# Patient Record
Sex: Female | Born: 1970 | Race: Black or African American | Hispanic: No | Marital: Single | Smoking: Former smoker
Health system: Southern US, Community
[De-identification: ages and names within clinical notes are randomized; demographics above are authoritative.]

## PROBLEM LIST (undated history)

## (undated) DIAGNOSIS — D649 Anemia, unspecified: Secondary | ICD-10-CM

## (undated) DIAGNOSIS — M1990 Unspecified osteoarthritis, unspecified site: Secondary | ICD-10-CM

## (undated) DIAGNOSIS — F329 Major depressive disorder, single episode, unspecified: Secondary | ICD-10-CM

## (undated) DIAGNOSIS — M797 Fibromyalgia: Secondary | ICD-10-CM

## (undated) DIAGNOSIS — F32A Depression, unspecified: Secondary | ICD-10-CM

## (undated) DIAGNOSIS — M1611 Unilateral primary osteoarthritis, right hip: Secondary | ICD-10-CM

## (undated) DIAGNOSIS — I10 Essential (primary) hypertension: Secondary | ICD-10-CM

## (undated) HISTORY — DX: Anemia, unspecified: D64.9

## (undated) HISTORY — DX: Unilateral primary osteoarthritis, right hip: M16.11

## (undated) HISTORY — PX: OTHER SURGICAL HISTORY: SHX169

---

## 1998-05-17 ENCOUNTER — Encounter: Admission: RE | Admit: 1998-05-17 | Discharge: 1998-05-17 | Payer: Self-pay | Admitting: Family Medicine

## 1998-05-17 ENCOUNTER — Emergency Department (HOSPITAL_COMMUNITY): Admission: EM | Admit: 1998-05-17 | Discharge: 1998-05-17 | Payer: Self-pay | Admitting: Emergency Medicine

## 1998-07-02 ENCOUNTER — Emergency Department (HOSPITAL_COMMUNITY): Admission: EM | Admit: 1998-07-02 | Discharge: 1998-07-02 | Payer: Self-pay | Admitting: Emergency Medicine

## 1998-07-04 ENCOUNTER — Encounter: Admission: RE | Admit: 1998-07-04 | Discharge: 1998-07-04 | Payer: Self-pay | Admitting: Family Medicine

## 1998-07-06 ENCOUNTER — Ambulatory Visit (HOSPITAL_COMMUNITY): Admission: RE | Admit: 1998-07-06 | Discharge: 1998-07-06 | Payer: Self-pay

## 1998-07-09 ENCOUNTER — Encounter: Admission: RE | Admit: 1998-07-09 | Discharge: 1998-10-07 | Payer: Self-pay

## 1998-07-24 ENCOUNTER — Encounter: Admission: RE | Admit: 1998-07-24 | Discharge: 1998-07-24 | Payer: Self-pay | Admitting: Family Medicine

## 1998-08-21 ENCOUNTER — Encounter: Admission: RE | Admit: 1998-08-21 | Discharge: 1998-08-21 | Payer: Self-pay | Admitting: Family Medicine

## 1998-08-23 ENCOUNTER — Encounter: Admission: RE | Admit: 1998-08-23 | Discharge: 1998-08-23 | Payer: Self-pay | Admitting: Family Medicine

## 1998-08-28 ENCOUNTER — Encounter: Admission: RE | Admit: 1998-08-28 | Discharge: 1998-08-28 | Payer: Self-pay | Admitting: Family Medicine

## 1999-01-01 ENCOUNTER — Encounter: Admission: RE | Admit: 1999-01-01 | Discharge: 1999-01-01 | Payer: Self-pay | Admitting: Family Medicine

## 1999-05-13 ENCOUNTER — Encounter: Admission: RE | Admit: 1999-05-13 | Discharge: 1999-05-13 | Payer: Self-pay | Admitting: Sports Medicine

## 1999-09-19 ENCOUNTER — Encounter: Admission: RE | Admit: 1999-09-19 | Discharge: 1999-09-19 | Payer: Self-pay | Admitting: Family Medicine

## 1999-09-30 ENCOUNTER — Encounter: Admission: RE | Admit: 1999-09-30 | Discharge: 1999-09-30 | Payer: Self-pay | Admitting: Family Medicine

## 1999-10-09 ENCOUNTER — Encounter: Payer: Self-pay | Admitting: Emergency Medicine

## 1999-10-09 ENCOUNTER — Emergency Department (HOSPITAL_COMMUNITY): Admission: EM | Admit: 1999-10-09 | Discharge: 1999-10-09 | Payer: Self-pay | Admitting: Emergency Medicine

## 1999-10-28 ENCOUNTER — Encounter: Admission: RE | Admit: 1999-10-28 | Discharge: 1999-10-28 | Payer: Self-pay | Admitting: Sports Medicine

## 1999-11-14 ENCOUNTER — Encounter: Admission: RE | Admit: 1999-11-14 | Discharge: 1999-11-14 | Payer: Self-pay | Admitting: Family Medicine

## 1999-12-08 ENCOUNTER — Encounter: Admission: RE | Admit: 1999-12-08 | Discharge: 1999-12-08 | Payer: Self-pay | Admitting: Family Medicine

## 1999-12-15 ENCOUNTER — Encounter: Admission: RE | Admit: 1999-12-15 | Discharge: 1999-12-15 | Payer: Self-pay | Admitting: Family Medicine

## 1999-12-29 ENCOUNTER — Encounter: Admission: RE | Admit: 1999-12-29 | Discharge: 1999-12-29 | Payer: Self-pay | Admitting: Family Medicine

## 2000-01-01 ENCOUNTER — Encounter: Admission: RE | Admit: 2000-01-01 | Discharge: 2000-01-01 | Payer: Self-pay | Admitting: Sports Medicine

## 2000-01-01 ENCOUNTER — Encounter: Payer: Self-pay | Admitting: Sports Medicine

## 2000-03-23 ENCOUNTER — Emergency Department (HOSPITAL_COMMUNITY): Admission: EM | Admit: 2000-03-23 | Discharge: 2000-03-23 | Payer: Self-pay | Admitting: Emergency Medicine

## 2000-03-23 ENCOUNTER — Encounter: Payer: Self-pay | Admitting: Emergency Medicine

## 2000-03-26 ENCOUNTER — Encounter: Admission: RE | Admit: 2000-03-26 | Discharge: 2000-03-26 | Payer: Self-pay | Admitting: Family Medicine

## 2000-05-11 ENCOUNTER — Encounter: Admission: RE | Admit: 2000-05-11 | Discharge: 2000-06-16 | Payer: Self-pay | Admitting: *Deleted

## 2000-11-22 ENCOUNTER — Encounter: Admission: RE | Admit: 2000-11-22 | Discharge: 2000-11-22 | Payer: Self-pay | Admitting: Family Medicine

## 2000-12-13 ENCOUNTER — Encounter: Admission: RE | Admit: 2000-12-13 | Discharge: 2000-12-13 | Payer: Self-pay | Admitting: Family Medicine

## 2001-03-15 ENCOUNTER — Encounter: Admission: RE | Admit: 2001-03-15 | Discharge: 2001-03-15 | Payer: Self-pay | Admitting: Sports Medicine

## 2001-07-27 ENCOUNTER — Encounter: Admission: RE | Admit: 2001-07-27 | Discharge: 2001-07-27 | Payer: Self-pay | Admitting: Family Medicine

## 2001-12-22 ENCOUNTER — Inpatient Hospital Stay (HOSPITAL_COMMUNITY): Admission: AD | Admit: 2001-12-22 | Discharge: 2001-12-22 | Payer: Self-pay | Admitting: *Deleted

## 2002-09-19 ENCOUNTER — Encounter: Payer: Self-pay | Admitting: Emergency Medicine

## 2002-09-19 ENCOUNTER — Emergency Department (HOSPITAL_COMMUNITY): Admission: EM | Admit: 2002-09-19 | Discharge: 2002-09-19 | Payer: Self-pay | Admitting: Emergency Medicine

## 2002-12-18 ENCOUNTER — Encounter (INDEPENDENT_AMBULATORY_CARE_PROVIDER_SITE_OTHER): Payer: Self-pay | Admitting: *Deleted

## 2002-12-18 ENCOUNTER — Encounter: Admission: RE | Admit: 2002-12-18 | Discharge: 2002-12-18 | Payer: Self-pay | Admitting: Family Medicine

## 2002-12-29 ENCOUNTER — Encounter: Admission: RE | Admit: 2002-12-29 | Discharge: 2002-12-29 | Payer: Self-pay | Admitting: Family Medicine

## 2003-01-05 ENCOUNTER — Encounter: Admission: RE | Admit: 2003-01-05 | Discharge: 2003-01-05 | Payer: Self-pay | Admitting: Family Medicine

## 2003-01-05 ENCOUNTER — Encounter: Payer: Self-pay | Admitting: *Deleted

## 2003-01-05 ENCOUNTER — Encounter: Admission: RE | Admit: 2003-01-05 | Discharge: 2003-01-05 | Payer: Self-pay | Admitting: *Deleted

## 2003-02-05 ENCOUNTER — Encounter: Admission: RE | Admit: 2003-02-05 | Discharge: 2003-02-05 | Payer: Self-pay | Admitting: Family Medicine

## 2003-02-08 ENCOUNTER — Encounter: Admission: RE | Admit: 2003-02-08 | Discharge: 2003-02-08 | Payer: Self-pay | Admitting: Family Medicine

## 2003-03-27 ENCOUNTER — Encounter: Admission: RE | Admit: 2003-03-27 | Discharge: 2003-03-27 | Payer: Self-pay | Admitting: Family Medicine

## 2003-04-09 ENCOUNTER — Encounter: Admission: RE | Admit: 2003-04-09 | Discharge: 2003-04-09 | Payer: Self-pay | Admitting: Family Medicine

## 2003-04-10 ENCOUNTER — Encounter: Admission: RE | Admit: 2003-04-10 | Discharge: 2003-04-10 | Payer: Self-pay | Admitting: Sports Medicine

## 2003-06-29 ENCOUNTER — Emergency Department (HOSPITAL_COMMUNITY): Admission: AD | Admit: 2003-06-29 | Discharge: 2003-06-29 | Payer: Self-pay

## 2003-06-29 ENCOUNTER — Emergency Department (HOSPITAL_COMMUNITY): Admission: EM | Admit: 2003-06-29 | Discharge: 2003-06-29 | Payer: Self-pay | Admitting: Emergency Medicine

## 2003-06-29 ENCOUNTER — Encounter: Payer: Self-pay | Admitting: Emergency Medicine

## 2004-07-16 ENCOUNTER — Encounter: Admission: RE | Admit: 2004-07-16 | Discharge: 2004-07-16 | Payer: Self-pay | Admitting: Family Medicine

## 2004-07-16 ENCOUNTER — Other Ambulatory Visit: Admission: RE | Admit: 2004-07-16 | Discharge: 2004-07-16 | Payer: Self-pay | Admitting: Family Medicine

## 2004-08-18 ENCOUNTER — Ambulatory Visit: Payer: Self-pay | Admitting: Family Medicine

## 2005-11-04 ENCOUNTER — Emergency Department (HOSPITAL_COMMUNITY): Admission: EM | Admit: 2005-11-04 | Discharge: 2005-11-04 | Payer: Self-pay | Admitting: Emergency Medicine

## 2006-11-11 ENCOUNTER — Emergency Department (HOSPITAL_COMMUNITY): Admission: EM | Admit: 2006-11-11 | Discharge: 2006-11-11 | Payer: Self-pay | Admitting: Family Medicine

## 2006-11-18 ENCOUNTER — Encounter (INDEPENDENT_AMBULATORY_CARE_PROVIDER_SITE_OTHER): Payer: Self-pay | Admitting: Family Medicine

## 2006-11-18 ENCOUNTER — Other Ambulatory Visit: Admission: RE | Admit: 2006-11-18 | Discharge: 2006-11-18 | Payer: Self-pay | Admitting: Family Medicine

## 2006-11-18 ENCOUNTER — Ambulatory Visit: Payer: Self-pay | Admitting: Family Medicine

## 2006-11-18 LAB — CONVERTED CEMR LAB
Chlamydia, DNA Probe: NEGATIVE
Cholesterol: 174 mg/dL (ref 0–200)
GC Probe Amp, Genital: NEGATIVE
HDL: 52 mg/dL (ref >39)
LDL Cholesterol: 108 mg/dL — ABNORMAL HIGH (ref 0–99)
Total CHOL/HDL Ratio: 3.3
Triglycerides: 68 mg/dL (ref <150)
VLDL: 14 mg/dL (ref 0–40)

## 2007-10-17 ENCOUNTER — Emergency Department (HOSPITAL_COMMUNITY): Admission: EM | Admit: 2007-10-17 | Discharge: 2007-10-17 | Payer: Self-pay | Admitting: Family Medicine

## 2011-08-24 LAB — POCT URINALYSIS DIP (DEVICE)
Glucose, UA: NEGATIVE
Ketones, ur: 80 — AB
Nitrite: NEGATIVE
Operator id: 235561
Protein, ur: 100 — AB
Specific Gravity, Urine: 1.015
Urobilinogen, UA: 1
pH: 6

## 2011-08-24 LAB — POCT PREGNANCY, URINE: Operator id: 235561

## 2011-08-24 LAB — INFLUENZA A AND B ANTIGEN (CONVERTED LAB)
Inflenza A Ag: NEGATIVE
Influenza B Ag: NEGATIVE

## 2011-11-24 ENCOUNTER — Emergency Department (HOSPITAL_COMMUNITY): Payer: Medicaid Other

## 2011-11-24 ENCOUNTER — Encounter: Payer: Self-pay | Admitting: Emergency Medicine

## 2011-11-24 ENCOUNTER — Emergency Department (HOSPITAL_COMMUNITY)
Admission: EM | Admit: 2011-11-24 | Discharge: 2011-11-25 | Disposition: A | Discharge status: Home / Self Care | Payer: Medicaid Other | Attending: Emergency Medicine | Admitting: Emergency Medicine

## 2011-11-24 DIAGNOSIS — M1610 Unilateral primary osteoarthritis, unspecified hip: Secondary | ICD-10-CM | POA: Insufficient documentation

## 2011-11-24 DIAGNOSIS — M169 Osteoarthritis of hip, unspecified: Secondary | ICD-10-CM | POA: Insufficient documentation

## 2011-11-24 DIAGNOSIS — M542 Cervicalgia: Secondary | ICD-10-CM | POA: Insufficient documentation

## 2011-11-24 DIAGNOSIS — M25559 Pain in unspecified hip: Secondary | ICD-10-CM | POA: Insufficient documentation

## 2011-11-24 MED ORDER — KETOROLAC TROMETHAMINE 60 MG/2ML IM SOLN
30.0000 mg | Freq: Once | INTRAMUSCULAR | Status: AC
  Administered 2011-11-25: 30 mg via INTRAMUSCULAR
  Filled 2011-11-24: qty 2

## 2011-11-24 NOTE — ED Provider Notes (Signed)
History     CSN: [id number]  Arrival date & time 11/24/11  2018   First MD Initiated Contact with Patient 11/24/11 2331      Chief Complaint  Patient presents with  . Leg Pain    (Consider location/radiation/quality/duration/timing/severity/associated sxs/prior treatment) HPI Comments: Patient with bilateral hip pain has been going on and consistent for 5-6 months.  She's been taking over-the-counter nonsteroidals without relief.  Has an appointment with her doctor next month.  She didn't call until last week.  She, states she's not resting because she can't get in a comfortable position.  Denies trauma denies previous history of hip pain.  Denies back pain.  Denies constipation, urinary tract symptoms, flank pain  Patient is a 41 y.o. female presenting with leg pain. The history is provided by the patient.  Leg Pain  The incident occurred more than 1 week ago. The incident occurred at home. There was no injury mechanism. The pain is present in the left hip and right hip. The pain is at a severity of 7/10. The pain is severe. The pain has been constant since onset. Pertinent negatives include no numbness. She has tried NSAIDs for the symptoms. The treatment provided no relief.    History reviewed. No pertinent past medical history.  History reviewed. No pertinent past surgical history.  No family history on file.  History  Substance Use Topics  . Smoking status: Never Smoker   . Smokeless tobacco: Not on file  . Alcohol Use: No    OB History    Grav Para Term Preterm Abortions TAB SAB Ect Mult Living                  Review of Systems  HENT: Positive for neck pain.   Eyes: Negative.   Respiratory: Negative for cough.   Gastrointestinal: Negative for nausea and vomiting.  Genitourinary: Negative for dysuria, frequency and flank pain.  Musculoskeletal: Positive for arthralgias. Negative for back pain and joint swelling.  Skin: Negative.   Neurological: Negative for  weakness and numbness.  Hematological: Negative.   Psychiatric/Behavioral: Negative.     Allergies  Codeine  Home Medications   Current Outpatient Rx  Name Route Sig Dispense Refill  . ALKA-SELTZER PLUS COLD PO Oral Take 1 tablet by mouth daily as needed. For congestion.     . IBUPROFEN 200 MG PO TABS Oral Take 400-600 mg by mouth every 6 (six) hours as needed. For pain.     Marland Kitchen NYQUIL PO Oral Take 1 capsule by mouth at bedtime as needed. For cold like symptoms.       BP 128/84  Pulse 77  Temp(Src) 98 F (36.7 C) (Oral)  Resp 19  SpO2 100%  LMP 11/24/2011  Physical Exam  Constitutional: She is oriented to person, place, and time. She appears well-developed and well-nourished.  HENT:  Head: Normocephalic.  Eyes: Pupils are equal, round, and reactive to light.  Neck: Normal range of motion.  Cardiovascular: Normal rate.   Musculoskeletal: Normal range of motion. She exhibits no edema and no tenderness.       No hip bursar pain no lumbar tenderness no SI joint pain   Neurological: She is alert and oriented to person, place, and time.  Skin: Skin is warm.  Psychiatric: She has a normal mood and affect.    ED Course  Procedures (including critical care time)  Labs Reviewed - No data to display Dg Pelvis 1-2 Views  11/25/2011  *RADIOLOGY REPORT*  Clinical Data: Bilateral hip pain for 3 months.  PELVIS - 1-2 VIEW  Comparison: None.  Findings: Hips are located.  There is mild joint space narrowing medially within the right hip joint. There is subchondral cystic change within the right femoral head.  No acute findings in the pelvis or sacrum.  IMPRESSION:  1.  No acute findings.  . 2.  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  Original Report Authenticated By: Genevive Bi, M.D.     1. Osteoarthritis of hip       MDM  Will x-ray, pelvis, to look at the hip joints and this is arthritis.  We'll provide the correct medication for patient and allow her to follow up with  her primary care physician as scheduled next month        Arman Filter, NP 11/25/11 0002  Arman Filter, NP 11/25/11 0234

## 2011-11-24 NOTE — ED Notes (Signed)
Patient with pain from ankle to hips bilaterally.  Patient states this has been going on for 4-5 months.  Patient is CAOx3, no loss of bowel or bladder habits.  Patient states she has been trying to get into her primary but does not have a appointment until next month and it has been bothering her more tonight and needs to be seen.

## 2011-11-24 NOTE — ED Notes (Signed)
PT. REPORTS BILATERAL LEG PAIN FOR 5 MONTHS , DENIES INJURY OR FALL , AMBULATORY.

## 2011-11-25 NOTE — ED Provider Notes (Signed)
Medical screening examination/treatment/procedure(s) were performed by non-physician practitioner and as supervising physician I was immediately available for consultation/collaboration.   Lyanne Co, MD 11/25/11 [phone]

## 2011-11-25 NOTE — ED Notes (Addendum)
Pt in stretcher when downtime charting started. See down time charting for info past 00:15. Note entered when charting came back up at [facility]

## 2012-01-04 ENCOUNTER — Telehealth (HOSPITAL_BASED_OUTPATIENT_CLINIC_OR_DEPARTMENT_OTHER): Payer: Self-pay | Admitting: *Deleted

## 2012-04-26 ENCOUNTER — Encounter (HOSPITAL_COMMUNITY): Payer: Self-pay | Admitting: *Deleted

## 2012-04-26 ENCOUNTER — Emergency Department (HOSPITAL_COMMUNITY)
Admission: EM | Admit: 2012-04-26 | Discharge: 2012-04-26 | Disposition: A | Discharge status: Home / Self Care | Payer: Self-pay | Attending: Emergency Medicine | Admitting: Emergency Medicine

## 2012-04-26 DIAGNOSIS — F3289 Other specified depressive episodes: Secondary | ICD-10-CM | POA: Insufficient documentation

## 2012-04-26 DIAGNOSIS — F329 Major depressive disorder, single episode, unspecified: Secondary | ICD-10-CM | POA: Insufficient documentation

## 2012-04-26 DIAGNOSIS — M791 Myalgia, unspecified site: Secondary | ICD-10-CM

## 2012-04-26 DIAGNOSIS — M79609 Pain in unspecified limb: Secondary | ICD-10-CM | POA: Insufficient documentation

## 2012-04-26 DIAGNOSIS — M1990 Unspecified osteoarthritis, unspecified site: Secondary | ICD-10-CM | POA: Insufficient documentation

## 2012-04-26 DIAGNOSIS — F17200 Nicotine dependence, unspecified, uncomplicated: Secondary | ICD-10-CM | POA: Insufficient documentation

## 2012-04-26 DIAGNOSIS — E876 Hypokalemia: Secondary | ICD-10-CM | POA: Insufficient documentation

## 2012-04-26 HISTORY — DX: Unspecified osteoarthritis, unspecified site: M19.90

## 2012-04-26 HISTORY — DX: Major depressive disorder, single episode, unspecified: F32.9

## 2012-04-26 HISTORY — DX: Depression, unspecified: F32.A

## 2012-04-26 LAB — COMPREHENSIVE METABOLIC PANEL
ALT: 13 U/L (ref 0–35)
AST: 12 U/L (ref 0–37)
Calcium: 9.1 mg/dL (ref 8.4–10.5)
Creatinine, Ser: 0.63 mg/dL (ref 0.50–1.10)
GFR calc Af Amer: >90 mL/min (ref >90)
GFR calc non Af Amer: >90 mL/min (ref >90)
Glucose, Bld: 77 mg/dL (ref 70–99)
Sodium: 136 mEq/L (ref 135–145)
Total Protein: 7.2 g/dL (ref 6.0–8.3)

## 2012-04-26 MED ORDER — POTASSIUM CHLORIDE CRYS ER 20 MEQ PO TBCR
20.0000 meq | EXTENDED_RELEASE_TABLET | Freq: Once | ORAL | Status: AC
  Administered 2012-04-26: 20 meq via ORAL
  Filled 2012-04-26: qty 1

## 2012-04-26 MED ORDER — IBUPROFEN 600 MG PO TABS: 600.0000 mg | ORAL_TABLET | Freq: Four times a day (QID) | ORAL | Status: AC | PRN

## 2012-04-26 MED ORDER — IBUPROFEN 200 MG PO TABS
600.0000 mg | ORAL_TABLET | Freq: Once | ORAL | Status: AC
  Administered 2012-04-26: 600 mg via ORAL
  Filled 2012-04-26: qty 3

## 2012-04-26 MED ORDER — TRAMADOL HCL 50 MG PO TABS: 50.0000 mg | ORAL_TABLET | Freq: Four times a day (QID) | ORAL | Status: AC | PRN

## 2012-04-26 NOTE — ED Notes (Signed)
Patient states she has osteoarthritis x 1 year.  Patient has pain in both legs constantly

## 2012-04-26 NOTE — Discharge Instructions (Signed)
Your potassium level is slightly low (3.3) - eat plenty of fruits and vegetables, take your potassium supplement, and follow up with primary care doctor in 1-2 weeks. For pain, you may take motrin as need for pain. You may also take ultram as need for pain - no driving when taking. Follow up with primary care doctor in 1 week - call office to arrange appointment. Return to ER if worse, new symptoms, focal swelling/pain, fevers, weakness, other concern.     RESOURCE GUIDE  Chronic Pain Problems: Contact [facility]  [phone] Patients need to be referred by their primary care doctor.  Insufficient Money for Medicine: Contact United Way:  call "211" or [facility] [phone].  No Primary Care Doctor: - Call [facility]  [phone] - can help you locate a primary care doctor that  accepts your insurance, provides certain services, etc. - Physician Referral Service- [phone]  Agencies that provide inexpensive medical care: - [facility]  [phone] - [facility]  [phone] - [facility]  [phone] - Women's Clinic  [phone] - Planned Parenthood  [phone] [facility]  [phone]  Medicaid-accepting [facility] Providers: - [facility]- [address]  [phone], Mon-Fri 9am-7pm, Sat 9am-1pm - [facility]- [address] [place], [address] [place]  [phone] - [facility]- [address] [place]  [phone] [facility]- [address]  [phone] - Renaye Rakers- [address] [place], [address], [phone]  Only accepts [place] patients after they have their name  applied to their card  Self Pay (no insurance) in [place]: - Sickle Cell Patients: Dr Willey Blade, [facility]  [address] [place], [phone] - [facility]- [address] [place]  [phone]        [facility] [place]- [address]       -     [facility]- see information above (Speak to [employer] if you do not have insurance)       -  [facility]- [address] [place], [phone]       -  [facility]- [address],  [phone]       -  [facility]- [address], [phone]       -  Dr Julio Sicks-  [address], [place], [phone]       -  [facility]- [address], [phone]       -  [facility]- [address] [phone], also [address], [phone]       -    [facility]- [address] [place], [phone], 1st & 3rd Saturday   every month, 10am-1pm  1) Find a Doctor and Pay Out of Pocket Although you won't have to find out who is covered by your insurance plan, it is a good idea to ask around and get recommendations. You will then need to call the office and see if the doctor you have chosen will accept you as a new patient and what types of options they offer for patients who are self-pay. Some doctors offer discounts or will set up payment plans for their patients who do not have insurance, but you will  need to ask so you aren't surprised when you get to your appointment.  2) Contact Your Local Health Department Not all health departments have doctors that can see patients for sick visits, but many do, so it is worth a call to see if yours does. If you don't know where your local health department is, you can check in your phone book. The CDC also has a tool to help you locate your state's health department, and many state websites also have listings of all of their local health departments.  3) Find a Walk-in Clinic If your illness is not likely to be very severe or complicated, you may want to try a walk in clinic. These are popping up all over the country in pharmacies, drugstores, and shopping centers. They're usually staffed by nurse practitioners or  physician assistants that have been trained to treat common illnesses and complaints. They're usually fairly quick and inexpensive. However, if you have serious medical issues or chronic medical problems, these are probably not your best option  STD Testing - [facility] Department of [facility], STD Clinic, [address], [place], phone [phone] or [phone].  Monday - Friday, call for an appointment. [facility] Department of [employer], STD Clinic, [place] [address], [place], phone [phone] or [phone].  Monday - Friday, call for an appointment.  Abuse/Neglect: [facility] Child Abuse Hotline 320-211-4485 [facility] Child Abuse Hotline 402-627-7010 (After Hours)  Emergency Shelter:  [facility] 979-337-4195  Maternity Homes: - [facility] 2251758080 - [facility] [phone]  MRSA Hotline #:   772-223-4208  [facility] Resources  [facility] [place]  [employer] [facility] Dept. [address]                 [address]         [address] [place] [address]  Blondell Reveal Phone:  [phone]                                  Phone:  [phone]                   Phone:  2340732076  [facility], [phone] - [phone]       -     [facility] in [place], [address],                                  [phone], [facility] Child Abuse Hotline 219-319-5046 or 367-005-0502 (After Hours)   Behavioral Health Services  Substance Abuse Resources: - [facility] Drug Services  563-752-0769 - [facility] 228-124-9785 - [facility] [place] [phone] Floydene Flock [phone] - Residential & Outpatient  Substance Abuse Program  ([phone]  Psychological Services: [facility]  5854532390 Services  [phone] - [facility], [phone] [place]. [address] [place], ACCESS LINE: (504)814-4688 or (717)148-0298, [employer]  Dental Assistance  If unable to pay or uninsured, contact:  [facility] or [facility]. to become qualified for the adult dental clinic.  Patients with Medicaid: [facility] [phone] [address], [phone] [address] [phone]  If unable to pay, or uninsured, contact [facility] 830-099-0440) or [facility] Department [phone] in [place], [phone] in [facility]) to become qualified for the adult dental clinic  Other Low-Cost Community Dental Services: - [facility]- [address] [place], [place], [place], [address], [phone], [phone], 2nd and 4th Thursday of the month at 6:30am.  10 clients each day by appointment, can sometimes see walk-in patients if someone does not show for an appointment. [facility]- [address] Ether Griffins [place], [place], [address], [phone] - [facility]- [address], [place], [place], [address], [phone] [facility] Health Department- [phone] [facility] Health Department- [phone] [facility] [phone]            Hypokalemia Hypokalemia means a low potassium level in the blood.Potassium is an electrolyte that helps regulate the amount of fluid in the body. It also stimulates muscle contraction and maintains a stable acid-base balance.Most of the body's potassium is inside of cells, and only a very small amount is in the blood. Because the amount in the blood is so small, minor changes can have big effects. PREPARATION FOR TEST Testing for potassium requires taking a blood sample taken by needle from a vein in the arm. The skin is cleaned  thoroughly before the sample is drawn. There is no other special preparation needed. NORMAL VALUES Potassium levels below 3.5 mEq/L are abnormally low. Levels above 5.1 mEq/L are abnormally high. Ranges for normal findings may vary among different laboratories and hospitals. You should always check with your doctor after having lab work or other tests done to discuss the meaning of your test results and whether your values are considered within normal limits. MEANING OF TEST  Your caregiver will go over the test results with you and discuss the importance and meaning of your results, as well as treatment options and the need for additional tests, if necessary. A potassium level is frequently part of a routine medical exam. It is usually included as part of a whole "panel" of tests for several blood salts (such as Sodium and Chloride). It may be done as part of follow-up when a low potassium level was found in the past or other blood salts are suspected of being out of balance. A low potassium level might be suspected if you have one or more of the following:  Symptoms of weakness.   Abnormal heart rhythms.   High blood pressure and are taking medication to control this, especially water pills (diuretics).   Kidney disease that can affect your potassium level .   Diabetes requiring the use of insulin. The potassium may fall after taking insulin, especially if the diabetes had been out of control for a while.   A condition requiring the use of cortisone-type medication or certain types of antibiotics.   Vomiting and/or diarrhea for more than a day or two.   A stomach or intestinal condition that may not permit appropriate absorption of potassium.   Fainting  episodes.   Mental confusion.  OBTAINING TEST RESULTS It is your responsibility to obtain your test results. Ask the lab or department performing the test when and how you will get your results.  Please contact your caregiver directly  if you have not received the results within one week. At that time, ask if there is anything different or new you should be doing in relation to the results. TREATMENT Hypokalemia can be treated with potassium supplements taken by mouth and/or adjustments in your current medications. A diet high in potassium is also helpful. Foods with high potassium content are:  Peas, lentils, lima beans, nuts, and dried fruit.   Whole grain and bran cereals and breads.   Fresh fruit, vegetables (bananas, cantaloupe, grapefruit, oranges, tomatoes, honeydew melons, potatoes).   Orange and tomato juices.   Meats. If potassium supplement has been prescribed for you today or your medications have been adjusted, see your personal caregiver in time02 for a re-check.  SEEK MEDICAL CARE IF:  There is a feeling of worsening weakness.   You experience repeated chest palpitations.   You are diabetic and having difficulty keeping your blood sugars in the normal range.   You are experiencing vomiting and/or diarrhea.   You are having difficulty with any of your regular medications.  SEEK IMMEDIATE MEDICAL CARE IF:  You experience chest pain, shortness of breath, or episodes of dizziness.   You have been having vomiting or diarrhea for more than 2 days.   You have a fainting episode.  MAKE SURE YOU:   Understand these instructions.   Will watch your condition.   Will get help right away if you are not doing well or get worse.  Document Released: 11/02/2005 Document Revised: 10/22/2011 Document Reviewed: 10/13/2008 [facility] Patient Information 2012 [place], [place].     Myalgia, Adult Myalgia is the medical term for muscle pain. It is a symptom of many things. Nearly everyone at some time in their life has this. The most common cause for muscle pain is overuse or straining and more so when you are not in shape. Injuries and muscle bruises cause myalgias. Muscle pain without a history of injury can  also be caused by a virus. It frequently comes along with the flu. Myalgia not caused by muscle strain can be present in a large number of infectious diseases. Some autoimmune diseases like lupus and fibromyalgia can cause muscle pain. Myalgia may be mild, or severe. SYMPTOMS  The symptoms of myalgia are simply muscle pain. Most of the time this is short lived and the pain goes away without treatment. DIAGNOSIS  Myalgia is diagnosed by your caregiver by taking your history. This means you tell him when the problems began, what they are, and what has been happening. If this has not been a long term problem, your caregiver may want to watch for a while to see what will happen. If it has been long term, they may want to do additional testing. TREATMENT  The treatment depends on what the underlying cause of the muscle pain is. Often anti-inflammatory medications will help. HOME CARE INSTRUCTIONS  If the pain in your muscles came from overuse, slow down your activities until the problems go away.   Myalgia from overuse of a muscle can be treated with alternating hot and cold packs on the muscle affected or with cold for the first couple days. If either heat or cold seems to make things worse, stop their use.   Apply ice to the  sore area for 15 to 20 minutes, 3 to 4 times per day, while awake for the first 2 days of muscle soreness, or as directed. Put the ice in a plastic bag and place a towel between the bag of ice and your skin.   Only take over-the-counter or prescription medicines for pain, discomfort, or fever as directed by your caregiver.   Regular gentle exercise may help if you are not active.   Stretching before strenuous exercise can help lower the risk of myalgia. It is normal when beginning an exercise regimen to feel some muscle pain after exercising. Muscles that have not been used frequently will be sore at first. If the pain is extreme, this may mean injury to a muscle.  SEEK MEDICAL  CARE IF:  You have an increase in muscle pain that is not relieved with medication.   You begin to run a temperature.   You develop nausea and vomiting.   You develop a stiff and painful neck.   You develop a rash.   You develop muscle pain after a tick bite.   You have continued muscle pain while working out even after you are in good condition.  SEEK IMMEDIATE MEDICAL CARE IF: Any of your problems are getting worse and medications are not helping. MAKE SURE YOU:   Understand these instructions.   Will watch your condition.   Will get help right away if you are not doing well or get worse.  Document Released: 09/24/2006 Document Revised: 10/22/2011 Document Reviewed: 12/14/2006 [facility] Patient Information 2012 [place], [place].

## 2012-04-26 NOTE — ED Provider Notes (Signed)
History    This chart was scribed for Sheila Roots, MD, MD by Smitty Pluck. The patient was seen in room STRE6 and the patient's care was started at 2:32PM.   CSN: [id number]  Arrival date & time 04/26/12  1334   First MD Initiated Contact with Patient 04/26/12 1430      Chief Complaint  Patient presents with  . Leg Pain    (Consider location/radiation/quality/duration/timing/severity/associated sxs/prior treatment) Patient is a 41 y.o. female presenting with leg pain. The history is provided by the patient.  Leg Pain  Pertinent negatives include no numbness.   Sheila Chambers is a 41 y.o. female who presents to the Emergency Department complaining of moderate bilateral leg pain onset 1 year ago. Pt reports the pain is aching. Denies trauma and falls or injuries. Pt reports that she was told she had osteoarthritis. She denies fibromyalgia, disc and back problems. Reports that pain has been constant without radiation. Denies swelling. She denies any alleviating factors. Reports having pain in her arms bilaterally. No acute or abrupt change in pain. Is located in bil hips and thighs and bil arms. Pt says all muscles of arms and legs hurt, esp at rest. No change w exertion. No cp or sob. No unusual doe. No wt loss. Denies any unusual temp intolerance or feeling hot or cold. No skin changes or rashes. No recent new meds or new med use. No fever or chills. No focal joint or muscle swelling or tenderness. Denies neck or back pain. No numbness/weakness. No hx fibromyalgia or connective tissue disorder.     Past Medical History  Diagnosis Date  . Osteoarthritis   . Depressed     History reviewed. No pertinent past surgical history.  No family history on file.  History  Substance Use Topics  . Smoking status: Current Everyday Smoker  . Smokeless tobacco: Not on file  . Alcohol Use: No    OB History    Grav Para Term Preterm Abortions TAB SAB Ect Mult Living                   Review of Systems  Constitutional: Negative for fever and diaphoresis.  HENT: Negative for neck pain and neck stiffness.   Eyes: Negative for redness and visual disturbance.  Respiratory: Negative for cough and shortness of breath.   Cardiovascular: Negative for chest pain and leg swelling.  Gastrointestinal: Negative for vomiting, abdominal pain, diarrhea, abdominal distention and rectal pain.  Genitourinary: Negative for dysuria.  Musculoskeletal: Negative for back pain and joint swelling.  Skin: Negative for rash.  Neurological: Negative for weakness, numbness and headaches.  Hematological: Negative for adenopathy.    Allergies  Codeine  Home Medications   Current Outpatient Rx  Name Route Sig Dispense Refill  . IBUPROFEN 200 MG PO TABS Oral Take 400-600 mg by mouth every 6 (six) hours as needed. For pain.       BP 145/75  Pulse 70  Temp(Src) 98 F (36.7 C) (Oral)  Resp 20  SpO2 100%  Physical Exam  Nursing note and vitals reviewed. Constitutional: She is oriented to person, place, and time. She appears well-developed and well-nourished. No distress.  HENT:  Head: Normocephalic and atraumatic.  Mouth/Throat: Oropharynx is clear and moist.  Eyes: Conjunctivae and EOM are normal. Pupils are equal, round, and reactive to light. No scleral icterus.  Neck: Normal range of motion. Neck supple. No tracheal deviation present. No thyromegaly present.  Cardiovascular: Normal rate, regular rhythm, normal  heart sounds and intact distal pulses.   Pulmonary/Chest: Effort normal and breath sounds normal. No respiratory distress.  Abdominal: Soft. Bowel sounds are normal. She exhibits no distension. There is no tenderness.  Genitourinary:       No cva tenderness  Musculoskeletal: Normal range of motion. She exhibits no edema and no tenderness.       Spine nt. Good rom bil extremities without pain or focal bony tenderness. Distal pulses palp bil.   Neurological: She is alert and  oriented to person, place, and time. She displays normal reflexes.       Motor intact bil. Steady gait.   Skin: Skin is warm and dry.  Psychiatric: She has a normal mood and affect. Her behavior is normal.    ED Course  Procedures (including critical care time) DIAGNOSTIC STUDIES: Oxygen Saturation is 100% on room air, normal by my interpretation.    COORDINATION OF CARE: 2:34PM EDP discusses pt ED treatment with pt.  3:45PM EDP ordered medication: ibuprofen 600 mg, KCl tablet 20 mEq  Labs Reviewed  COMPREHENSIVE METABOLIC PANEL - Abnormal; Notable for the following:    Potassium 3.3 (*)    All other components within normal limits      MDM  I personally performed the services described in this documentation, which was scribed in my presence. The recorded information has been reviewed and considered. Sheila Roots, MD   k sl low, kcl po. Motrin po. Discussed need for pcp f/u.     Sheila Roots, MD 04/26/12 [phone]

## 2013-02-03 ENCOUNTER — Emergency Department (HOSPITAL_COMMUNITY)
Admission: EM | Admit: 2013-02-03 | Discharge: 2013-02-03 | Disposition: A | Discharge status: Home / Self Care | Payer: Self-pay | Attending: Emergency Medicine | Admitting: Emergency Medicine

## 2013-02-03 ENCOUNTER — Encounter (HOSPITAL_COMMUNITY): Payer: Self-pay

## 2013-02-03 DIAGNOSIS — Z8739 Personal history of other diseases of the musculoskeletal system and connective tissue: Secondary | ICD-10-CM | POA: Insufficient documentation

## 2013-02-03 DIAGNOSIS — F17200 Nicotine dependence, unspecified, uncomplicated: Secondary | ICD-10-CM | POA: Insufficient documentation

## 2013-02-03 DIAGNOSIS — R112 Nausea with vomiting, unspecified: Secondary | ICD-10-CM | POA: Insufficient documentation

## 2013-02-03 DIAGNOSIS — Z8659 Personal history of other mental and behavioral disorders: Secondary | ICD-10-CM | POA: Insufficient documentation

## 2013-02-03 DIAGNOSIS — R51 Headache: Secondary | ICD-10-CM | POA: Insufficient documentation

## 2013-02-03 MED ORDER — DIPHENHYDRAMINE HCL 50 MG/ML IJ SOLN
25.0000 mg | Freq: Once | INTRAMUSCULAR | Status: AC
  Administered 2013-02-03: 25 mg via INTRAVENOUS
  Filled 2013-02-03: qty 1

## 2013-02-03 MED ORDER — METOCLOPRAMIDE HCL 5 MG/ML IJ SOLN
10.0000 mg | Freq: Once | INTRAMUSCULAR | Status: AC
  Administered 2013-02-03: 10 mg via INTRAVENOUS
  Filled 2013-02-03: qty 2

## 2013-02-03 MED ORDER — METOCLOPRAMIDE HCL 10 MG PO TABS: 10.0000 mg | ORAL_TABLET | Freq: Four times a day (QID) | ORAL | Status: DC | PRN

## 2013-02-03 MED ORDER — SODIUM CHLORIDE 0.9 % IV BOLUS (SEPSIS)
1000.0000 mL | Freq: Once | INTRAVENOUS | Status: AC
  Administered 2013-02-03: 1000 mL via INTRAVENOUS

## 2013-02-03 NOTE — ED Notes (Addendum)
Pt presents with 2 week h/o frontal headache.  Pt reports pain has been constant, taking advil that "works for 30 minutes".  +photophobia and nausea.  Pt reports intermittent blurred vision and dizziness.

## 2013-02-03 NOTE — ED Notes (Signed)
Patient resting on stretcher, lights turned dow. Patient complaining of head pain. Rates pain 8/10 at this time. resp are even and unlabored. No acute distress noted. Will continue to monitor.

## 2013-02-03 NOTE — ED Notes (Signed)
Iv attempted X 2. Awaiting another RN to attempt.

## 2013-02-03 NOTE — ED Provider Notes (Signed)
I have supervised the resident on the management of this patient and agree with the note above. I personally interviewed and examined the patient and my addendum is below.   Sheila Chambers is a 42 y.o. female here with frontal headache. Frontal headache for months, not improved with motrin. Vitals stable. Neuro exam unremarkable. Not concerned for subarachnoid hemorrhage. Will give headache cocktail and reassess.   8:30 PM Felt better with reglan, benadryl, IVF. Will f/u with neuro outpatient.    Richardean Canal, MD 02/03/13 2030

## 2013-02-03 NOTE — ED Provider Notes (Signed)
History     CSN: [id number]  Arrival date & time 02/03/13  1421   First MD Initiated Contact with Patient 02/03/13 1750      No chief complaint on file.   (Consider location/radiation/quality/duration/timing/severity/associated sxs/prior treatment) Patient is a 42 y.o. female presenting with headaches. The history is provided by the patient.  Headache Pain location:  Frontal Quality:  Dull Radiates to:  Does not radiate Severity currently:  10/10 Onset quality:  Gradual Timing:  Constant Progression:  Worsening Chronicity:  Chronic Context: bright light   Relieved by:  NSAIDs Worsened by:  Light and sound Associated symptoms: nausea and vomiting   Associated symptoms: no abdominal pain, no blurred vision, no congestion, no cough, no diarrhea, no fever and no neck stiffness     Past Medical History  Diagnosis Date  . Osteoarthritis   . Depressed     History reviewed. No pertinent past surgical history.  History reviewed. No pertinent family history.  History  Substance Use Topics  . Smoking status: Current Every Day Smoker  . Smokeless tobacco: Not on file  . Alcohol Use: No    OB History   Grav Para Term Preterm Abortions TAB SAB Ect Mult Living                  Review of Systems  Constitutional: Negative for fever.  HENT: Negative for congestion and neck stiffness.   Eyes: Negative for blurred vision.  Respiratory: Negative for cough and shortness of breath.   Cardiovascular: Negative for chest pain.  Gastrointestinal: Positive for nausea and vomiting. Negative for abdominal pain and diarrhea.  Genitourinary: Negative for difficulty urinating.  Neurological: Positive for headaches.  All other systems reviewed and are negative.    Allergies  Codeine  Home Medications   Current Outpatient Rx  Name  Route  Sig  Dispense  Refill  . ibuprofen (ADVIL,MOTRIN) 200 MG tablet   Oral   Take 400-600 mg by mouth every 6 (six) hours as needed. For pain.             BP 152/94  Pulse 84  Temp(Src) 98.3 F (36.8 C) (Oral)  Resp 22  SpO2 100%  LMP 01/12/2013  Physical Exam  Nursing note and vitals reviewed. Constitutional: She is oriented to person, place, and time. She appears well-developed and well-nourished. No distress.  HENT:  Head: Normocephalic and atraumatic.  Mouth/Throat: Oropharynx is clear and moist.  Eyes: Conjunctivae are normal. Pupils are equal, round, and reactive to light. No scleral icterus.  Neck: Neck supple.  Cardiovascular: Normal rate, regular rhythm, normal heart sounds and intact distal pulses.   No murmur heard. Pulmonary/Chest: Effort normal and breath sounds normal. No stridor. No respiratory distress. She has no rales.  Abdominal: Soft. Bowel sounds are normal. She exhibits no distension. There is no tenderness.  Musculoskeletal: Normal range of motion. She exhibits no edema and no tenderness.  Neurological: She is alert and oriented to person, place, and time. She has normal strength. No cranial nerve deficit or sensory deficit. Coordination normal. GCS eye subscore is 4. GCS verbal subscore is 5. GCS motor subscore is 6.  Skin: Skin is warm and dry. No rash noted.  Psychiatric: She has a normal mood and affect. Her behavior is normal.    ED Course  Procedures (including critical care time)  Labs Reviewed - No data to display No results found.   1. Headache       MDM   42 yo  female complains of chronic headache, present daily for many weeks, but worse today.  Nonfocal neuro exam.  Picture not c/w SAH or meningitis.  Plan IV reglan, benadryl, and fluids.    8:32 PM feeling better and wants to go home.  Advised follow up with neurology.      Rennis Petty, MD 02/03/13 2032

## 2013-02-03 NOTE — ED Notes (Signed)
2 liters [place] applied per dr Silverio Lay.

## 2013-02-03 NOTE — ED Notes (Signed)
Patient report obtained from Crystal, RN, care taken over at this time.  

## 2013-02-03 NOTE — ED Notes (Signed)
Patient given discharge paperwork; went over discharge instructions with patient.  Patient instructed to take prescriptions as directed, to follow up with primary care physician/referral, and to return to the ED for new, worsening, or concerning symptoms.

## 2013-11-01 ENCOUNTER — Encounter (HOSPITAL_COMMUNITY): Payer: Self-pay | Admitting: Emergency Medicine

## 2013-11-01 ENCOUNTER — Emergency Department (HOSPITAL_COMMUNITY)
Admission: EM | Admit: 2013-11-01 | Discharge: 2013-11-01 | Disposition: A | Discharge status: Home / Self Care | Payer: BC Managed Care – PPO | Attending: Emergency Medicine | Admitting: Emergency Medicine

## 2013-11-01 ENCOUNTER — Emergency Department (HOSPITAL_COMMUNITY): Payer: BC Managed Care – PPO

## 2013-11-01 DIAGNOSIS — N390 Urinary tract infection, site not specified: Secondary | ICD-10-CM | POA: Insufficient documentation

## 2013-11-01 DIAGNOSIS — F17200 Nicotine dependence, unspecified, uncomplicated: Secondary | ICD-10-CM | POA: Insufficient documentation

## 2013-11-01 DIAGNOSIS — R1110 Vomiting, unspecified: Secondary | ICD-10-CM | POA: Insufficient documentation

## 2013-11-01 DIAGNOSIS — Z8659 Personal history of other mental and behavioral disorders: Secondary | ICD-10-CM | POA: Insufficient documentation

## 2013-11-01 DIAGNOSIS — Z792 Long term (current) use of antibiotics: Secondary | ICD-10-CM | POA: Insufficient documentation

## 2013-11-01 DIAGNOSIS — Z8739 Personal history of other diseases of the musculoskeletal system and connective tissue: Secondary | ICD-10-CM | POA: Insufficient documentation

## 2013-11-01 DIAGNOSIS — Z79899 Other long term (current) drug therapy: Secondary | ICD-10-CM | POA: Insufficient documentation

## 2013-11-01 DIAGNOSIS — B9789 Other viral agents as the cause of diseases classified elsewhere: Secondary | ICD-10-CM | POA: Insufficient documentation

## 2013-11-01 DIAGNOSIS — IMO0001 Reserved for inherently not codable concepts without codable children: Secondary | ICD-10-CM | POA: Insufficient documentation

## 2013-11-01 DIAGNOSIS — J029 Acute pharyngitis, unspecified: Secondary | ICD-10-CM | POA: Insufficient documentation

## 2013-11-01 DIAGNOSIS — B349 Viral infection, unspecified: Secondary | ICD-10-CM

## 2013-11-01 DIAGNOSIS — Z3202 Encounter for pregnancy test, result negative: Secondary | ICD-10-CM | POA: Insufficient documentation

## 2013-11-01 DIAGNOSIS — R079 Chest pain, unspecified: Secondary | ICD-10-CM | POA: Insufficient documentation

## 2013-11-01 LAB — CBC WITH DIFFERENTIAL/PLATELET
Basophils Relative: 0 % (ref 0–1)
HCT: 40.6 % (ref 36.0–46.0)
Hemoglobin: 14.2 g/dL (ref 12.0–15.0)
Lymphocytes Relative: 18 % (ref 12–46)
Lymphs Abs: 1.4 10*3/uL (ref 0.7–4.0)
MCHC: 35 g/dL (ref 30.0–36.0)
Monocytes Absolute: 0.6 10*3/uL (ref 0.1–1.0)
Monocytes Relative: 9 % (ref 3–12)
Neutro Abs: 5.4 10*3/uL (ref 1.7–7.7)
Neutrophils Relative %: 71 % (ref 43–77)
RBC: 4.44 MIL/uL (ref 3.87–5.11)

## 2013-11-01 LAB — COMPREHENSIVE METABOLIC PANEL
Albumin: 4.1 g/dL (ref 3.5–5.2)
Alkaline Phosphatase: 112 U/L (ref 39–117)
BUN: 12 mg/dL (ref 6–23)
CO2: 24 mEq/L (ref 19–32)
Chloride: 99 mEq/L (ref 96–112)
Creatinine, Ser: 0.81 mg/dL (ref 0.50–1.10)
GFR calc Af Amer: >90 mL/min (ref >90)
GFR calc non Af Amer: 87 mL/min — ABNORMAL LOW (ref >90)
Glucose, Bld: 79 mg/dL (ref 70–99)
Potassium: 5.1 mEq/L (ref 3.5–5.1)
Total Bilirubin: 0.3 mg/dL (ref 0.3–1.2)

## 2013-11-01 LAB — URINALYSIS, ROUTINE W REFLEX MICROSCOPIC
Ketones, ur: >80 mg/dL — AB
Nitrite: NEGATIVE
Protein, ur: 100 mg/dL — AB
pH: 5.5 (ref 5.0–8.0)

## 2013-11-01 LAB — URINE MICROSCOPIC-ADD ON

## 2013-11-01 LAB — LIPASE, BLOOD: Lipase: 33 U/L (ref 11–59)

## 2013-11-01 MED ORDER — ONDANSETRON HCL 4 MG PO TABS: 4.0000 mg | ORAL_TABLET | Freq: Four times a day (QID) | ORAL | Status: DC

## 2013-11-01 MED ORDER — ONDANSETRON HCL 4 MG/2ML IJ SOLN
4.0000 mg | Freq: Once | INTRAMUSCULAR | Status: AC
  Administered 2013-11-01: 4 mg via INTRAVENOUS
  Filled 2013-11-01: qty 2

## 2013-11-01 MED ORDER — KETOROLAC TROMETHAMINE 30 MG/ML IJ SOLN
30.0000 mg | Freq: Once | INTRAMUSCULAR | Status: AC
  Administered 2013-11-01: 30 mg via INTRAVENOUS
  Filled 2013-11-01: qty 1

## 2013-11-01 MED ORDER — SODIUM CHLORIDE 0.9 % IV BOLUS (SEPSIS)
1000.0000 mL | Freq: Once | INTRAVENOUS | Status: AC
  Administered 2013-11-01: 1000 mL via INTRAVENOUS

## 2013-11-01 MED ORDER — CEFTRIAXONE SODIUM 1 G IJ SOLR
1.0000 g | Freq: Once | INTRAMUSCULAR | Status: AC
  Administered 2013-11-01: 1 g via INTRAVENOUS
  Filled 2013-11-01: qty 10

## 2013-11-01 MED ORDER — CIPROFLOXACIN HCL 250 MG PO TABS: 250.0000 mg | ORAL_TABLET | Freq: Two times a day (BID) | ORAL | Status: DC

## 2013-11-01 NOTE — ED Notes (Addendum)
Rt sided abd pain x since sat has cough a lot  cold  Congestion n/v/d voiding a lot  Has green sputum

## 2013-11-01 NOTE — ED Provider Notes (Signed)
CSN: [id number]     Arrival date & time 11/01/13  0809 History   First MD Initiated Contact with Patient 11/01/13 0818     Chief Complaint  Patient presents with  . Cough  . Emesis  . Abdominal Pain   (Consider location/radiation/quality/duration/timing/severity/associated sxs/prior Treatment) The history is provided by the patient. No language interpreter was used.  Sheila Chambers is a 42 year old female with past medical history of osteoarthritis and depression presenting to emergency department for congestion, cough, vomiting, diarrhea, headache, bodyaches abdominal pain all started on Saturday. Patient reports that the cough is productive with greenish-colored sputum. Patient reports that the nasal congestion is clear. Patient reports she's been appearance abdominal pain described as an achiness, tightness that is localized to the entire abdomen without radiation. Patient reported that she has been having bodyaches all over her body. Reported that she had 3 episodes of emesis since Saturday mainly of stomach contents-NB/NB. Reported that she's been having diarrhea, every day at least 3-4 times per day. Patient reports she's been experiencing chills, reported fever of 101-102F. Patient reports she's been feeling weak. States she's been only able to be drinking water, reported that she's been having decreased appetite. Reported that she's been having to use Tylenol or ibuprofen as needed for relief. Patient reports that prior to the symptoms she was experiencing chest discomfort localized to the center for chest described as a "ball in the Center for chest." Denied melena, hematochezia, numbness, tingling, blurred vision, sudden loss of vision. Patient works at a nursing home, CNA. PCP none  Past Medical History  Diagnosis Date  . Osteoarthritis   . Depressed    History reviewed. No pertinent past surgical history. No family history on file. History  Substance Use Topics  . Smoking status:  Current Every Day Smoker  . Smokeless tobacco: Not on file  . Alcohol Use: No   OB History   Grav Para Term Preterm Abortions TAB SAB Ect Mult Living                 Review of Systems  Constitutional: Positive for fever and chills.  HENT: Positive for congestion and sore throat. Negative for trouble swallowing.   Eyes: Negative for pain and visual disturbance.  Respiratory: Positive for cough. Negative for chest tightness and shortness of breath.   Cardiovascular: Positive for chest pain.  Musculoskeletal: Positive for myalgias. Negative for back pain.  Neurological: Positive for headaches. Negative for dizziness.  All other systems reviewed and are negative.    Allergies  Codeine  Home Medications   Current Outpatient Rx  Name  Route  Sig  Dispense  Refill  . Calcium Carbonate-Vitamin D (CALCIUM + D PO)   Oral   Take 1 tablet by mouth daily.         Marland Kitchen ibuprofen (ADVIL,MOTRIN) 200 MG tablet   Oral   Take 800 mg by mouth every 6 (six) hours as needed. For pain.         . Multiple Vitamin (MULTIVITAMIN WITH MINERALS) TABS   Oral   Take 1 tablet by mouth daily.         . Multiple Vitamins-Minerals (HAIR/SKIN/NAILS PO)   Oral   Take 1 tablet by mouth daily.         . ciprofloxacin (CIPRO) 250 MG tablet   Oral   Take 1 tablet (250 mg total) by mouth every 12 (twelve) hours.   10 tablet   0   . ondansetron (ZOFRAN) 4 MG  tablet   Oral   Take 1 tablet (4 mg total) by mouth every 6 (six) hours.   12 tablet   0    BP 132/85  Pulse 65  Temp(Src) 99 F (37.2 C) (Oral)  Resp 18  SpO2 100%  LMP 11/01/2013 Physical Exam  Nursing note and vitals reviewed. Constitutional: She is oriented to person, place, and time. She appears well-developed and well-nourished. No distress.  Patient is not diaphoretic  HENT:  Head: Normocephalic and atraumatic.  Right Ear: External ear normal.  Left Ear: External ear normal.  Mouth/Throat: Oropharynx is clear and moist.  No oropharyngeal exudate.  Negative swelling, erythema, inflammation, lesions, sores, exudate, petechiae noted to the posterior oropharynx bilateral tonsils. Uvula midline, symmetrical elevation.  Eyes: Conjunctivae and EOM are normal. Pupils are equal, round, and reactive to light. Right eye exhibits no discharge. Left eye exhibits no discharge.  Neck: Normal range of motion. Neck supple. No tracheal deviation present.  Negative neck stiffness Negative nuchal rigidity Negative cervical lymphadenopathy Negative meningeal signs  Cardiovascular: Normal rate, regular rhythm and normal heart sounds.  Exam reveals no friction rub.   No murmur heard. Pulses:      Radial pulses are 2+ on the right side, and 2+ on the left side.       Dorsalis pedis pulses are 2+ on the right side, and 2+ on the left side.  Pulmonary/Chest: Effort normal and breath sounds normal. No respiratory distress. She has no wheezes. She has no rales. She exhibits no tenderness.  Negative respiratory distress Patient is able to speak in full sentences without difficulty Airway intact  Abdominal: Soft. Bowel sounds are normal. There is tenderness. There is no guarding.  Normal appearance abdomen Negative distention Bowel sounds normal active in all 4 quadrants Discomfort upon palpation to the abdomen, generalized Negative acute abdomen, negative peritoneal signs  Musculoskeletal: Normal range of motion.  Lymphadenopathy:    She has no cervical adenopathy.  Neurological: She is alert and oriented to person, place, and time. She exhibits normal muscle tone. Coordination normal.  Strength intact with equal distribution noted to upper lower extremities bilaterally  Skin: Skin is warm and dry. No rash noted. She is not diaphoretic. No erythema.  Psychiatric: She has a normal mood and affect. Her behavior is normal. Thought content normal.    ED Course  Procedures (including critical care time)  12:23 PM This provider  re-assessed the patient. Patient reported that her body hurts all over and that she is having aching sensations. This provider discussed labs and imaging results in great detail, patient understood. Pain medications ordered for patient.   3:16 PM This provider re-assessed the patient. Reported that she is feeling better - denied chest pain, shortness of breath, difficulty breathing. Reported that her legs are bothering her - described that this is muscle aches. Patient able to eat crackers with peanut butter and gingerale without difficulty. Negative episodes of emesis while in ED setting.   Labs Review Labs Reviewed  COMPREHENSIVE METABOLIC PANEL - Abnormal; Notable for the following:    Total Protein 9.2 (*)    AST 46 (*)    GFR calc non Af Amer 87 (*)    All other components within normal limits  URINALYSIS, ROUTINE W REFLEX MICROSCOPIC - Abnormal; Notable for the following:    APPearance CLOUDY (*)    Specific Gravity, Urine 1.035 (*)    Hgb urine dipstick SMALL (*)    Bilirubin Urine SMALL (*)  Ketones, ur >80 (*)    Protein, ur 100 (*)    Leukocytes, UA MODERATE (*)    All other components within normal limits  URINE MICROSCOPIC-ADD ON - Abnormal; Notable for the following:    Casts HYALINE CASTS (*)    All other components within normal limits  CBC WITH DIFFERENTIAL  LIPASE, BLOOD  TROPONIN I  PREGNANCY, URINE   Imaging Review Dg Chest 2 View  11/01/2013   CLINICAL DATA:  Cough  EXAM: CHEST  2 VIEW  COMPARISON:  11/23/2011  FINDINGS: The heart and pulmonary vascularity are within normal limits. The lungs are well aerated bilaterally. No focal infiltrate or sizable effusion is seen. No bony abnormality is noted.  IMPRESSION: No active cardiopulmonary disease.   Electronically Signed   By: Alcide Clever M.D.   On: 11/01/2013 08:55    EKG Interpretation    Date/Time:  Wednesday November 01 2013 11:48:35 EST Ventricular Rate:  85 PR Interval:  152 QRS Duration: 84 QT  Interval:  356 QTC Calculation: 423 R Axis:   55 Text Interpretation:  Sinus rhythm since last tracing no significant change Confirmed by WENTZ  MD, ELLIOTT ([id number]) on 11/01/2013 12:48:11 PM            MDM   1. UTI (lower urinary tract infection)   2. Viral illness     Medications  ondansetron (ZOFRAN) injection 4 mg (4 mg Intravenous Given 11/01/13 1009)  sodium chloride 0.9 % bolus 1,000 mL (0 mLs Intravenous Stopped 11/01/13 1132)  sodium chloride 0.9 % bolus 1,000 mL (0 mLs Intravenous Stopped 11/01/13 1407)  cefTRIAXone (ROCEPHIN) 1 g in dextrose 5 % 50 mL IVPB (0 g Intravenous Stopped 11/01/13 1221)  ketorolac (TORADOL) 30 MG/ML injection 30 mg (30 mg Intravenous Given 11/01/13 1227)  sodium chloride 0.9 % bolus 1,000 mL (0 mLs Intravenous Stopped 11/01/13 1556)   Filed Vitals:   11/01/13 1200 11/01/13 1234 11/01/13 1432 11/01/13 1516  BP: 131/79 135/90 112/82 132/85  Pulse:  81 62 65  Temp:   98.7 F (37.1 C) 99 F (37.2 C)  TempSrc:   Oral Oral  Resp: 22 15 17 18   SpO2:  98% 100% 100%    Patient presenting to emergency department with abdominal pain, nausea, vomiting, diarrhea, cough, chest tightness, nasal congestion, myalgia, headache the been ongoing since Saturday. Patient is a CNA at a nursing home. Alert and oriented. GCS 15. Heart rate and rhythm normal. Lungs clear to auscultation to upper and lower lobes bilaterally. Negative pain upon palpation to the chest wall. Pulses palpable and strong, radial and DP 2+. Bowel sounds normal active in all 4 quadrants. Discomfort upon palpation to the abdomen in all quadrants-generalized. Negative acute abdomen, negative peritoneal signs. Nonsurgical abdomen identified. Full range of motion to upper and lower extremities bilaterally. Strength intact. Oral exam unremarkable. Ear exam unremarkable bilaterally. EKG negative acute ischemic findings noted. Troponin negative. UA noted high specific gravity - patient most likely  dehydrated. Moderate leukocytes noted with WBC of 11-20 - urine infection noted. CBC negative elevation in WBC - negative leukocytosis noted. CMP with hemolysis noted secondary to dehydration. Mild elevation in AST of 46. Chest xray negative for acute cardiopulmonary disease.   Patient noted to be dehydrated - patient given 3 L of fluid while in ED setting. Doubt cardiac issue. Doubt acute abdominal process-nonsurgical abdomen. Suspicion to be viral infection. Patient has been at high risk secondary to working in the nursing home. Patient has urinary tract  infection will discharge with antibiotics. Discussed with patient to rest and stay hydrated. Referred patient to primary care provider. Discussed with patient to use Tylenol as needed for discomfort. Discussed with patient warm soaks. Discussed with patient to closely monitor symptoms and if symptoms are to worsen or change to report back to emergency department - strict return structures given. Patient agreed to plan of care, understood, all questions answered.  Raymon Mutton, PA-C 11/02/13 1802  Raymon Mutton, PA-C 11/02/13 1802

## 2013-11-03 NOTE — ED Provider Notes (Signed)
Medical screening examination/treatment/procedure(s) were performed by non-physician practitioner and as supervising physician I was immediately available for consultation/collaboration.  Flint Melter, MD 11/03/13 [phone]

## 2014-03-20 ENCOUNTER — Encounter (HOSPITAL_COMMUNITY): Payer: Self-pay | Admitting: Emergency Medicine

## 2014-03-20 ENCOUNTER — Emergency Department (HOSPITAL_COMMUNITY): Payer: Self-pay

## 2014-03-20 ENCOUNTER — Emergency Department (HOSPITAL_COMMUNITY): Payer: BC Managed Care – PPO

## 2014-03-20 ENCOUNTER — Emergency Department (HOSPITAL_COMMUNITY)
Admission: EM | Admit: 2014-03-20 | Discharge: 2014-03-20 | Disposition: A | Discharge status: Home / Self Care | Payer: Self-pay | Attending: Emergency Medicine | Admitting: Emergency Medicine

## 2014-03-20 DIAGNOSIS — S8990XA Unspecified injury of unspecified lower leg, initial encounter: Secondary | ICD-10-CM | POA: Insufficient documentation

## 2014-03-20 DIAGNOSIS — Z8659 Personal history of other mental and behavioral disorders: Secondary | ICD-10-CM | POA: Insufficient documentation

## 2014-03-20 DIAGNOSIS — S99929A Unspecified injury of unspecified foot, initial encounter: Principal | ICD-10-CM

## 2014-03-20 DIAGNOSIS — Y9389 Activity, other specified: Secondary | ICD-10-CM | POA: Insufficient documentation

## 2014-03-20 DIAGNOSIS — W06XXXA Fall from bed, initial encounter: Secondary | ICD-10-CM | POA: Insufficient documentation

## 2014-03-20 DIAGNOSIS — Y929 Unspecified place or not applicable: Secondary | ICD-10-CM | POA: Insufficient documentation

## 2014-03-20 DIAGNOSIS — S99919A Unspecified injury of unspecified ankle, initial encounter: Principal | ICD-10-CM

## 2014-03-20 DIAGNOSIS — F17200 Nicotine dependence, unspecified, uncomplicated: Secondary | ICD-10-CM | POA: Insufficient documentation

## 2014-03-20 DIAGNOSIS — M25562 Pain in left knee: Secondary | ICD-10-CM

## 2014-03-20 DIAGNOSIS — M1990 Unspecified osteoarthritis, unspecified site: Secondary | ICD-10-CM | POA: Insufficient documentation

## 2014-03-20 LAB — CBC
HCT: 39.9 % (ref 36.0–46.0)
Hemoglobin: 13.4 g/dL (ref 12.0–15.0)
MCH: 31.3 pg (ref 26.0–34.0)
MCHC: 33.6 g/dL (ref 30.0–36.0)
MCV: 93.2 fL (ref 78.0–100.0)
PLATELETS: 318 10*3/uL (ref 150–400)
RBC: 4.28 MIL/uL (ref 3.87–5.11)
RDW: 14.4 % (ref 11.5–15.5)
WBC: 8.8 10*3/uL (ref 4.0–10.5)

## 2014-03-20 LAB — URINALYSIS, ROUTINE W REFLEX MICROSCOPIC
Bilirubin Urine: NEGATIVE
Glucose, UA: NEGATIVE mg/dL
Ketones, ur: NEGATIVE mg/dL
NITRITE: NEGATIVE
Protein, ur: NEGATIVE mg/dL
SPECIFIC GRAVITY, URINE: 1.016 (ref 1.005–1.030)
Urobilinogen, UA: 0.2 mg/dL (ref 0.0–1.0)
pH: 7 (ref 5.0–8.0)

## 2014-03-20 LAB — BASIC METABOLIC PANEL
BUN: 9 mg/dL (ref 6–23)
CALCIUM: 10.1 mg/dL (ref 8.4–10.5)
CO2: 22 mEq/L (ref 19–32)
Chloride: 104 mEq/L (ref 96–112)
Creatinine, Ser: 0.72 mg/dL (ref 0.50–1.10)
GFR calc Af Amer: >90 mL/min (ref >90)
GLUCOSE: 103 mg/dL — AB (ref 70–99)
Potassium: 4.5 mEq/L (ref 3.7–5.3)
Sodium: 138 mEq/L (ref 137–147)

## 2014-03-20 LAB — RAPID URINE DRUG SCREEN, HOSP PERFORMED
Amphetamines: NOT DETECTED
BARBITURATES: NOT DETECTED
Benzodiazepines: NOT DETECTED
Cocaine: NOT DETECTED
Opiates: NOT DETECTED
Tetrahydrocannabinol: POSITIVE — AB

## 2014-03-20 LAB — D-DIMER, QUANTITATIVE: D-Dimer, Quant: <0.27 ug/mL-FEU (ref 0.00–0.48)

## 2014-03-20 LAB — URINE MICROSCOPIC-ADD ON

## 2014-03-20 LAB — CK: CK TOTAL: 91 U/L (ref 7–177)

## 2014-03-20 MED ORDER — FENTANYL CITRATE 0.05 MG/ML IJ SOLN
50.0000 ug | Freq: Once | INTRAMUSCULAR | Status: AC
  Administered 2014-03-20: 50 ug via INTRAVENOUS
  Filled 2014-03-20: qty 2

## 2014-03-20 MED ORDER — ONDANSETRON HCL 4 MG/2ML IJ SOLN
4.0000 mg | Freq: Once | INTRAMUSCULAR | Status: AC
  Administered 2014-03-20: 4 mg via INTRAVENOUS
  Filled 2014-03-20: qty 2

## 2014-03-20 MED ORDER — TRAMADOL HCL 50 MG PO TABS: 50.0000 mg | ORAL_TABLET | Freq: Three times a day (TID) | ORAL | Status: DC | PRN

## 2014-03-20 MED ORDER — DIAZEPAM 5 MG PO TABS
5.0000 mg | ORAL_TABLET | Freq: Once | ORAL | Status: AC
  Administered 2014-03-20: 5 mg via ORAL
  Filled 2014-03-20: qty 1

## 2014-03-20 MED ORDER — IBUPROFEN 200 MG PO TABS
600.0000 mg | ORAL_TABLET | Freq: Once | ORAL | Status: AC
  Administered 2014-03-20: 08:00:00 600 mg via ORAL
  Filled 2014-03-20 (×2): qty 1

## 2014-03-20 NOTE — ED Notes (Signed)
Pt woke up this am at normal time and fell getting out of bed; pt felt like left knee twisted during fall; c/o of pain in left and right knee. Can not ambulate or stand on leg due to pain; 10/10 pain level on arrival. No discoloration no deformities in the field. Normally ambulates independently.

## 2014-03-20 NOTE — ED Notes (Signed)
Pt in XR, RN will assess when pt returns to room

## 2014-03-20 NOTE — ED Provider Notes (Signed)
CSN: [id number]     Arrival date & time 03/20/14  0533 History   None    Chief Complaint  Patient presents with  . Knee Pain    HPI  Patient presents with severe left leg pain since 4am when she got up and had sudden throbbing pain. She is nonspecific in her description of the pain. She denies known preceding injury and states she fell to her knees and was unable to get up. She did not notice any swelling or redness. She has not had similar pain in the past. She has h/o arthritis in her knees and other joints but has never had pain like this. Denies fever/chills, chest pain, dyspnea, or h/o VTE. She reports inability to move the leg and is laying on her right side, also stating that now her right leg is starting to hurt. She reports inability to roll over or straighten either leg. She has had some nausea due to the pain.  Past Medical History  Diagnosis Date  . Osteoarthritis   . Depressed    History reviewed. No pertinent past surgical history. History reviewed. No pertinent family history. History  Substance Use Topics  . Smoking status: Current Every Day Smoker    Types: Cigars  . Smokeless tobacco: Not on file  . Alcohol Use: No  Additional SH: Pt has court date today  OB History   Grav Para Term Preterm Abortions TAB SAB Ect Mult Living                 Review of Systems Per HPI.  Allergies  Codeine  Home Medications  Reports only using vitamins below and occasionally advil for pain. Prior to Admission medications   Medication Sig Start Date End Date Taking? Authorizing Provider  Calcium Carbonate-Vitamin D (CALCIUM + D PO) Take 1 tablet by mouth 2 (two) times daily.    Yes Historical Provider, MD  Multiple Vitamin (MULTIVITAMIN WITH MINERALS) TABS Take 1 tablet by mouth daily.   Yes Historical Provider, MD   BP 132/72  Pulse 77  Temp(Src) 98.2 F (36.8 C) (Oral)  Resp 16  Ht 5\' 7"  (1.702 m)  Wt 170 lb (77.111 kg)  BMI 26.62 kg/m2  SpO2 100%  LMP  02/18/2014 Physical Exam GEN: Uncomfortable-appearing, lying on right side HEENT: Atraumatic, normocephalic, neck supple, EOMI, sclera clear  CV: RRR, no murmurs, rubs, or gallops, 2+ bilateral DP pulses PULM: CTAB, normal effort SKIN: No rash or cyanosis; warm and well-perfused EXTR: No lower extremity edema or calf tenderness; left knee and lower thigh tender, left hip mildly tender, ?volitional inability to lift or extend either leg but displays 1/5 strength hip abduction and knee extension, no erythema or swelling, able to wiggle toes of both feet and plantar flex feet, no deformity or vericosity PSYCH: Normal rate and volume of speech, tearful NEURO: Awake, alert, no focal deficits grossly, normal speech  ED Course  Procedures (including critical care time) Labs Review Labs Reviewed  BASIC METABOLIC PANEL - Abnormal; Notable for the following:    Glucose, Bld 103 (*)    All other components within normal limits  URINE RAPID DRUG SCREEN (HOSP PERFORMED) - Abnormal; Notable for the following:    Tetrahydrocannabinol POSITIVE (*)    All other components within normal limits  URINALYSIS, ROUTINE W REFLEX MICROSCOPIC - Abnormal; Notable for the following:    Hgb urine dipstick SMALL (*)    Leukocytes, UA LARGE (*)    All other components within normal limits  URINE MICROSCOPIC-ADD ON - Abnormal; Notable for the following:    Bacteria, UA FEW (*)    All other components within normal limits  D-DIMER, QUANTITATIVE  CK  CBC    Imaging Review Dg Hip 1 View Left  03/20/2014   CLINICAL DATA:  Left hip pain  EXAM: LEFT HIP - 1 VIEW:  COMPARISON:  None.  FINDINGS: Left hip is located.  No femoral neck fracture.  No dislocation.  IMPRESSION: No fracture or dislocation of left hip.   Electronically Signed   By: Genevive BiStewart  Edmunds M.D.   On: 03/20/2014 11:54   Dg Knee Complete 4 Views Left  03/20/2014   CLINICAL DATA:  Left knee pain  EXAM: LEFT KNEE - COMPLETE 4+ VIEW  COMPARISON:  None.   FINDINGS: There is no evidence of fracture, dislocation, or joint effusion. There is no evidence of arthropathy or other focal bone abnormality. Soft tissues are unremarkable.  IMPRESSION: No acute abnormality noted.   Electronically Signed   By: Alcide CleverMark  Lukens M.D.   On: 03/20/2014 11:17     EKG Interpretation None      MDM   Final diagnoses:  Knee pain, left  DDx for leg/knee pain with no known trauma and no swelling or erythema includes arthritic pain, DVT (wells criteria score 0), unknown injury/bruising, hypokalemia/other electrolyte abnormality, and malingering. Pt has h/o mild hypokalemia. Most likely muscle strain/sprain. In the ED, she received fentanyl with little relief but nursing reporting she intermittently is able to move around freely. - Will dose ibuprofen 600mg  x 1. If no relief, morphine 1mg  x 1 to see if pt can go to Xray - Workup in ED negative (CBC, BMET, DG left hip and knee, D dimer, CK) - UA with large leukocytes, neg nitrite, few bacteria) - UDS>> positive for marijuana;  - No LE duplex at this time given Wells criteria 0 - Crutches provided in ED. - Tramadol # 10, prn severe pain. Ibuprofen q6 hours with food prn pain. - Rest, ice, elevate, compress recommended. - Establish care with and f/u with PCP. Return precautions reviewed.  Leona SingletonMaria T Carylon Tamburro, MD PGY-2, [facility]   Leona SingletonMaria T Deondra Labrador, MD 03/20/14 1210

## 2014-03-20 NOTE — Discharge Instructions (Signed)
Your labs were normal. We were unable to get xrays today. We are providing crutches and you should use these and follow up with a primary doctor if this pain does not get better in 3-5 days. Your exam did not show signs of a clot, infection, or joint swelling. Your leg looked normal on exam. Take ibuprofen with food for pain. Raise the leg. Ice the leg. Take tramadol as needed for severe pain. Stop if you develop hives.   Emergency Department Resource Guide 1) Find a Doctor and Pay Out of Pocket Although you won't have to find out who is covered by your insurance plan, it is a good idea to ask around and get recommendations. You will then need to call the office and see if the doctor you have chosen will accept you as a new patient and what types of options they offer for patients who are self-pay. Some doctors offer discounts or will set up payment plans for their patients who do not have insurance, but you will need to ask so you aren't surprised when you get to your appointment.  2) Contact Your Local Health Department Not all health departments have doctors that can see patients for sick visits, but many do, so it is worth a call to see if yours does. If you don't know where your local health department is, you can check in your phone book. The CDC also has a tool to help you locate your state's health department, and many state websites also have listings of all of their local health departments.  3) Find a Walk-in Clinic If your illness is not likely to be very severe or complicated, you may want to try a walk in clinic. These are popping up all over the country in pharmacies, drugstores, and shopping centers. They're usually staffed by nurse practitioners or physician assistants that have been trained to treat common illnesses and complaints. They're usually fairly quick and inexpensive. However, if you have serious medical issues or chronic medical problems, these are probably not your best  option.  No Primary Care Doctor: - Call Health Connect at  [phone] - they can help you locate a primary care doctor that  accepts your insurance, provides certain services, etc. - Physician Referral Service- [phone]  Chronic Pain Problems: Organization         Address  Phone   Notes  [facility]  [phone] Patients need to be referred by their primary care doctor.   Agencies that provide inexpensive medical care: Organization         Address  Phone   Notes  [facility]  [phone]   [facility]    [phone]) [phone]   [facility] [address] [place], [place] [address] [phone]) [phone]   [facility] [address], [place] [phone]   [facility]    [phone]   [facility]    [phone]   [facility]  [address], [place] Phone:  [phone]) [phone], Fax:  [phone] Hours of Operation:  9 am - 6 pm, M-F.  Also accepts Medicaid/Medicare and self-pay.  [facility]  [address], [place] Phone: [phone], Fax: [phone]. Hours of Operation:  8:30 am - 5:30 pm, M-F.  Also accepts Medicaid and self-pay.  [facility] [place] [address], [place] Phone: [phone](336)  [phone]   [facility] [address], [place], [place] [phone], Ext. [phone] Mondays & Thursdays: 7-9 AM.  First 15 patients are seen on a first come, first serve basis.    Medicaid-accepting [facility] Providers:  Organization         Address  Phone   Notes  [facility] [address], [place] [phone] Also accepts self-pay patients.  [facility] [address], [place]  [phone]   [facility] [address], [place] [phone]) [phone]   [facility]  [address] [place] [address], [place] [phone]   Renaye RakersVeita Bland [address], [place]   [phone] Only accepts [place] patients after they have their name applied to their card.   Self-Pay (no insurance) in [facility]:  Organization         Address  Phone   Notes  Sickle Cell Patients, [facility] [address], [place] [phone]   [facility] [address], [place] [phone]) [phone]   [facility] [place]  [address], [place] [phone]   [facility]/Dr. Osei-Bonsu  [address] [place] [address], [place] or [phone], [place] [phone] Phone number for both [place] and [place] locations is the same.  [facility] [address], [place] [phone]   [facility] [address] [place] [address], [place] or [address] [phone]   [facility] [address], [place] [phone], phone; [phone], fax Sees patients 1st and 3rd Saturday of every month.  Must not qualify for public or private insurance (i.e. Medicaid, Medicare, [place] Health Choice, Veterans' Benefits)  Household income should be no more than 200% of the poverty level The clinic cannot treat you if you are pregnant or think you are pregnant  Sexually transmitted diseases are not treated at the clinic.

## 2014-03-20 NOTE — ED Notes (Signed)
This RN called XR to inform them pt states she is ready to have her XR's

## 2014-03-20 NOTE — ED Provider Notes (Signed)
I performed a history and physical examination of  Sheila Chambers and discussed her management with Dr. Grandville SilosMaria T. I agree with the history, physical, assessment, and plan of care, with the following exceptions: None I was present for the following procedures: None  Time Spent in Critical Care of the patient: None  Time spent in discussions with the patient and family: 15 min  Loucille Takach  Pt with sudden onset left leg pain. No signs of infection on exam, no trauma, xrays are normal. Dimer ordered, and also neg, so is CPK. Will d.c, and we advised pcp f/u for further eval.  Derwood KaplanAnkit Monay [place], MD 03/20/14 2149

## 2014-03-20 NOTE — ED Notes (Signed)
XR called informing this RN pt would not allow them complete XR's due to increase pain, EDP [place]

## 2014-03-20 NOTE — Discharge Planning (Signed)
[facility] Community Liaison  Spoke to patient about follow up care and establishing care with a provider. Patient states she recently relocated to the area and has no PCP or insurance. Patient was given the orange card application, instructions and informed to contact me for an appointment once the application is complete. Patient was also given a resource guide and resources on where to obtain discharge medications. My contact information was also provided.

## 2015-07-16 ENCOUNTER — Other Ambulatory Visit (HOSPITAL_COMMUNITY): Payer: Self-pay | Admitting: *Deleted

## 2015-07-16 DIAGNOSIS — Z1231 Encounter for screening mammogram for malignant neoplasm of breast: Secondary | ICD-10-CM

## 2015-07-24 ENCOUNTER — Ambulatory Visit (HOSPITAL_COMMUNITY): Payer: Medicaid Other | Attending: *Deleted

## 2017-05-11 ENCOUNTER — Emergency Department (HOSPITAL_COMMUNITY)
Admission: EM | Admit: 2017-05-11 | Discharge: 2017-05-11 | Disposition: A | Discharge status: Home / Self Care | Payer: Medicaid Other | Attending: Emergency Medicine | Admitting: Emergency Medicine

## 2017-05-11 ENCOUNTER — Encounter (HOSPITAL_COMMUNITY): Payer: Self-pay | Admitting: Vascular Surgery

## 2017-05-11 DIAGNOSIS — Y9389 Activity, other specified: Secondary | ICD-10-CM | POA: Insufficient documentation

## 2017-05-11 DIAGNOSIS — Y9289 Other specified places as the place of occurrence of the external cause: Secondary | ICD-10-CM | POA: Insufficient documentation

## 2017-05-11 DIAGNOSIS — S29012A Strain of muscle and tendon of back wall of thorax, initial encounter: Secondary | ICD-10-CM | POA: Insufficient documentation

## 2017-05-11 DIAGNOSIS — Z87891 Personal history of nicotine dependence: Secondary | ICD-10-CM | POA: Insufficient documentation

## 2017-05-11 DIAGNOSIS — T148XXA Other injury of unspecified body region, initial encounter: Secondary | ICD-10-CM

## 2017-05-11 DIAGNOSIS — Y33XXXA Other specified events, undetermined intent, initial encounter: Secondary | ICD-10-CM | POA: Insufficient documentation

## 2017-05-11 DIAGNOSIS — Y990 Civilian activity done for income or pay: Secondary | ICD-10-CM | POA: Insufficient documentation

## 2017-05-11 MED ORDER — METHOCARBAMOL 500 MG PO TABS: 500.0000 mg | ORAL_TABLET | Freq: Two times a day (BID) | ORAL | 0 refills | Status: DC

## 2017-05-11 MED ORDER — DICLOFENAC SODIUM 75 MG PO TBEC: 75.0000 mg | DELAYED_RELEASE_TABLET | Freq: Two times a day (BID) | ORAL | 0 refills | Status: DC

## 2017-05-11 NOTE — ED Notes (Signed)
PA in. 

## 2017-05-11 NOTE — ED Provider Notes (Signed)
[facility] DEPT Provider Note   CSN: [id number] Arrival date & time: 05/11/17  1014  By signing my name below, I, Sheila Chambers, attest that this documentation has been prepared under the direction and in the presence of Sheila Chambers Sheila. Vallarie Fei, PA-C. Electronically Signed: Doreatha MartinEva Chambers, ED Scribe. 05/11/17. 11:39 AM.    History   Chief Complaint Chief Complaint  Patient presents with  . Back Pain    HPI Sheila Chambers is a 46 y.o. female who presents to the Emergency Department complaining of 8/10 mid back pain that began a few hours ago. 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 She otherwise denies injury or trauma. She reports h/o similar back pain. Pt is ambulatory with minimal difficulty. She states pain is sharp with certain movements. She has tried ibuprofen with no relief. Pt reports h/o osteoarthritis. No h/o cancer, IVDU, back surgery, DM. She denies bowel or bladder incontinence, saddle anesthesia, pain radiation to the legs, fever, cough, abdominal pain, neck pain, SOB, nausea, emesis, dysuria, hematuria, frequency, rash. She denies numbness, focal weakness or paresthesia of the lower extremities.     The history is provided by the patient. No language interpreter was used.    Past Medical History:  Diagnosis Date  . Depressed   . Osteoarthritis     There are no active problems to display for this patient.   History reviewed. No pertinent surgical history.  OB History    No data available       Home Medications    Prior to Admission medications   Medication Sig Start Date End Date Taking? Authorizing Provider  Calcium Carbonate-Vitamin D (CALCIUM + D PO) Take 1 tablet by mouth 2 (two) times daily.     [provider]  Multiple Vitamin (MULTIVITAMIN WITH MINERALS) TABS Take 1 tablet by mouth daily.    [provider]  traMADol (ULTRAM) 50 MG tablet Take 1 tablet (50 mg total) by  mouth every 8 (eight) hours as needed. 03/20/14   Leona Singletonhekkekandam, Maria T, MD    Family History No family history on file.  Social History Social History  Substance Use Topics  . Smoking status: Former Smoker    Types: Cigars  . Smokeless tobacco: Never Used  . Alcohol use No     Allergies   Codeine   Review of Systems Review of Systems  Constitutional: Negative for fever.  Respiratory: Negative for cough and shortness of breath.   Gastrointestinal: Negative for abdominal pain, nausea and vomiting.       Negative for bowel incontinence.   Genitourinary: Negative for dysuria, frequency and hematuria.       Negative for bladder incontinence and saddle anesthesia.   Musculoskeletal: Positive for back pain. Negative for neck pain.  Skin: Negative for rash.  Neurological: Negative for weakness and numbness.       Negative for paresthesias.   All other systems reviewed and are negative.    Physical Exam Updated Vital Signs BP 127/85 (BP Location: Right Arm)   Pulse 63   Temp 97.6 F (36.4 C) (Oral)   Resp 20   Ht 5\' 7"  (1.702 m)   Wt 162 lb (73.5 kg)   SpO2 100%   BMI 25.37 kg/m   Physical Exam  Constitutional: She appears well-developed and well-nourished.  HENT:  Head: Normocephalic.  Eyes: Conjunctivae are normal.  Cardiovascular: Normal rate, regular rhythm and normal heart sounds.  Pulmonary/Chest: Effort normal and breath sounds normal. No respiratory distress. She has no wheezes. She has no rales.  Abdominal: She exhibits no distension.  Musculoskeletal: Normal range of motion. She exhibits tenderness.  Diffusely limited ROM of the back. Pain with lateral rotation to the left. Tenderness to the thoracic paraspinal musculature. No midline cervical, thoracic or lumbar spinal tenderness. No step-offs, crepitance or deformity.   Neurological: She is alert.  Good strength and ROM of the upper extremities. No ataxia  Skin: Skin is warm and dry.  Psychiatric: She  has a normal mood and affect. Her behavior is normal.  Nursing note and vitals reviewed.    ED Treatments / Results   DIAGNOSTIC STUDIES: Oxygen Saturation is 100% on RA, normal by my interpretation.    COORDINATION OF CARE: 11:33 AM Discussed treatment plan with pt at bedside which includes supportive care and pt agreed to plan.    Procedures Procedures (including critical care time)  Medications Ordered in ED Medications - No data to display   Initial Impression / Assessment and Plan / ED Course  I have reviewed the triage vital signs and the nursing notes.      Sheila Chambers is a 47 y.o. female who presents to ED for mid back pain after heavy lifting.   Patient demonstrates no lower extremity weakness, saddle anesthesia, bowel or bladder incontinence or neuro deficits. No concern for cauda equina. No fevers or other infectious symptoms to suggest that the patient's back pain is due to an infection. Lower extremities are neurovascularly intact and patient is ambulating without difficulty. I have reviewed return precautions, including the development of any of these signs or symptoms and the patient has voiced understanding. I reviewed supportive care instructions including NSAIDs, early range of motion exercises and PCP follow-up if symptoms do not improve. RX for voltaren and robaxin. Patient voiced understanding and agreement with plan. All questions answered.   Final Clinical Impressions(s) / ED Diagnoses   Final diagnoses:  Muscle strain    New Prescriptions Discharge Medication List as of 05/11/2017 12:15 PM    START taking these medications   Details  diclofenac (VOLTAREN) 75 MG EC tablet Take 1 tablet (75 mg total) by mouth 2 (two) times daily., Starting Tue 05/11/2017, Print    methocarbamol (ROBAXIN) 500 MG tablet Take 1 tablet (500 mg total) by mouth 2 (two) times daily., Starting Tue 05/11/2017, Print        An After Visit Summary was printed and given to the  patient.  I personally performed the services in this documentation, which was scribed in my presence.  The recorded information has been reviewed and considered.   Barnet Pall.    Elson Areas, PA-C 05/11/17 1502    Alvira Monday, MD 05/12/17 1423

## 2017-05-11 NOTE — Discharge Instructions (Signed)
Return if any problems.

## 2017-05-11 NOTE — ED Triage Notes (Signed)
Pt reports to the ED for eval of mid/low back pain. 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 She denies any numbness, tingling, paralysis, or bowel or bladder incontinence.

## 2017-05-26 ENCOUNTER — Other Ambulatory Visit: Payer: Self-pay | Admitting: *Deleted

## 2017-05-26 DIAGNOSIS — Z1231 Encounter for screening mammogram for malignant neoplasm of breast: Secondary | ICD-10-CM

## 2019-01-02 ENCOUNTER — Encounter (HOSPITAL_COMMUNITY): Payer: Self-pay | Admitting: Emergency Medicine

## 2019-01-02 ENCOUNTER — Emergency Department (HOSPITAL_COMMUNITY)
Admission: EM | Admit: 2019-01-02 | Discharge: 2019-01-02 | Disposition: A | Discharge status: Home / Self Care | Payer: PRIVATE HEALTH INSURANCE | Attending: Emergency Medicine | Admitting: Emergency Medicine

## 2019-01-02 ENCOUNTER — Emergency Department (HOSPITAL_COMMUNITY): Payer: PRIVATE HEALTH INSURANCE

## 2019-01-02 ENCOUNTER — Other Ambulatory Visit: Payer: Self-pay

## 2019-01-02 DIAGNOSIS — D649 Anemia, unspecified: Secondary | ICD-10-CM | POA: Diagnosis not present

## 2019-01-02 DIAGNOSIS — R079 Chest pain, unspecified: Secondary | ICD-10-CM | POA: Diagnosis present

## 2019-01-02 DIAGNOSIS — J40 Bronchitis, not specified as acute or chronic: Secondary | ICD-10-CM | POA: Diagnosis not present

## 2019-01-02 DIAGNOSIS — Z79899 Other long term (current) drug therapy: Secondary | ICD-10-CM | POA: Diagnosis not present

## 2019-01-02 DIAGNOSIS — Z87891 Personal history of nicotine dependence: Secondary | ICD-10-CM | POA: Insufficient documentation

## 2019-01-02 LAB — COMPREHENSIVE METABOLIC PANEL
ALT: 14 U/L (ref 0–44)
AST: 19 U/L (ref 15–41)
Albumin: 3.8 g/dL (ref 3.5–5.0)
Alkaline Phosphatase: 82 U/L (ref 38–126)
Anion gap: 11 (ref 5–15)
BUN: 6 mg/dL (ref 6–20)
CO2: 21 mmol/L — ABNORMAL LOW (ref 22–32)
Calcium: 9.1 mg/dL (ref 8.9–10.3)
Chloride: 104 mmol/L (ref 98–111)
Creatinine, Ser: 0.75 mg/dL (ref 0.44–1.00)
GFR calc Af Amer: >60 mL/min (ref >60)
GFR calc non Af Amer: >60 mL/min (ref >60)
Glucose, Bld: 105 mg/dL — ABNORMAL HIGH (ref 70–99)
Potassium: 3.3 mmol/L — ABNORMAL LOW (ref 3.5–5.1)
Sodium: 136 mmol/L (ref 135–145)
TOTAL PROTEIN: 7.8 g/dL (ref 6.5–8.1)
Total Bilirubin: 0.7 mg/dL (ref 0.3–1.2)

## 2019-01-02 LAB — CBC WITH DIFFERENTIAL/PLATELET
ABS IMMATURE GRANULOCYTES: 0.01 10*3/uL (ref 0.00–0.07)
BASOS ABS: 0.1 10*3/uL (ref 0.0–0.1)
Basophils Relative: 1 %
Eosinophils Absolute: 0.3 10*3/uL (ref 0.0–0.5)
Eosinophils Relative: 5 %
HCT: 30.6 % — ABNORMAL LOW (ref 36.0–46.0)
HEMOGLOBIN: 9 g/dL — AB (ref 12.0–15.0)
Immature Granulocytes: 0 %
LYMPHS PCT: 32 %
Lymphs Abs: 1.8 10*3/uL (ref 0.7–4.0)
MCH: 24.5 pg — ABNORMAL LOW (ref 26.0–34.0)
MCHC: 29.4 g/dL — ABNORMAL LOW (ref 30.0–36.0)
MCV: 83.2 fL (ref 80.0–100.0)
Monocytes Absolute: 0.5 10*3/uL (ref 0.1–1.0)
Monocytes Relative: 8 %
NEUTROS ABS: 3.1 10*3/uL (ref 1.7–7.7)
NEUTROS PCT: 54 %
NRBC: 0 % (ref 0.0–0.2)
Platelets: 445 10*3/uL — ABNORMAL HIGH (ref 150–400)
RBC: 3.68 MIL/uL — ABNORMAL LOW (ref 3.87–5.11)
RDW: 17 % — AB (ref 11.5–15.5)
WBC: 5.7 10*3/uL (ref 4.0–10.5)

## 2019-01-02 LAB — I-STAT BETA HCG BLOOD, ED (MC, WL, AP ONLY)

## 2019-01-02 LAB — I-STAT TROPONIN, ED
TROPONIN I, POC: 0 ng/mL (ref 0.00–0.08)
Troponin i, poc: 0 ng/mL (ref 0.00–0.08)

## 2019-01-02 LAB — D-DIMER, QUANTITATIVE (NOT AT ARMC): D DIMER QUANT: 0.58 ug{FEU}/mL — AB (ref 0.00–0.50)

## 2019-01-02 MED ORDER — IOPAMIDOL (ISOVUE-370) INJECTION 76%
100.0000 mL | Freq: Once | INTRAVENOUS | Status: AC | PRN
  Administered 2019-01-02: 65 mL via INTRAVENOUS

## 2019-01-02 MED ORDER — BENZONATATE 100 MG PO CAPS: 100.0000 mg | ORAL_CAPSULE | Freq: Three times a day (TID) | ORAL | 0 refills | Status: DC

## 2019-01-02 MED ORDER — POTASSIUM CHLORIDE CRYS ER 20 MEQ PO TBCR
40.0000 meq | EXTENDED_RELEASE_TABLET | Freq: Once | ORAL | Status: AC
  Administered 2019-01-02: 40 meq via ORAL
  Filled 2019-01-02: qty 2

## 2019-01-02 MED ORDER — ALBUTEROL SULFATE HFA 108 (90 BASE) MCG/ACT IN AERS
2.0000 | INHALATION_SPRAY | Freq: Once | RESPIRATORY_TRACT | Status: AC
  Administered 2019-01-02: 2 via RESPIRATORY_TRACT
  Filled 2019-01-02: qty 6.7

## 2019-01-02 MED ORDER — IPRATROPIUM-ALBUTEROL 0.5-2.5 (3) MG/3ML IN SOLN
3.0000 mL | Freq: Once | RESPIRATORY_TRACT | Status: AC
  Administered 2019-01-02: 3 mL via RESPIRATORY_TRACT
  Filled 2019-01-02: qty 3

## 2019-01-02 MED ORDER — PREDNISONE 20 MG PO TABS
60.0000 mg | ORAL_TABLET | Freq: Once | ORAL | Status: AC
  Administered 2019-01-02: 60 mg via ORAL
  Filled 2019-01-02: qty 3

## 2019-01-02 MED ORDER — PREDNISONE 10 MG PO TABS: 40.0000 mg | ORAL_TABLET | Freq: Every day | ORAL | 0 refills | Status: AC

## 2019-01-02 MED ORDER — FERROUS SULFATE 325 (65 FE) MG PO TABS: 325.0000 mg | ORAL_TABLET | Freq: Every day | ORAL | 0 refills | Status: DC

## 2019-01-02 MED ORDER — IOPAMIDOL (ISOVUE-370) INJECTION 76%
INTRAVENOUS | Status: AC
  Filled 2019-01-02: qty 100

## 2019-01-02 MED ORDER — KETOROLAC TROMETHAMINE 15 MG/ML IJ SOLN
15.0000 mg | Freq: Once | INTRAMUSCULAR | Status: AC
  Administered 2019-01-02: 15 mg via INTRAVENOUS
  Filled 2019-01-02: qty 1

## 2019-01-02 NOTE — ED Provider Notes (Signed)
[facility] Note   CSN: [id number] Arrival date & time: 01/02/19  0755     History   Chief Complaint Chief Complaint  Patient presents with  . Chest Pain    HPI Sheila Chambers is a 48 y.o. female.  HPI   48 year old female with a history of arthritis presents with concern for shortness of breath and cough for a month and a half, and 2 days of chest pain.  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  Reports she is continued to go to work and she had hoped that her symptoms would improve, however she has had continuing cough over the last month and a half that is productive of sputum.  Reports she does not look at the color, but it appears clear to white in the emergency department.  Denies known hemoptysis.  Reports that she feels hot and cold, but does not know of fevers.  Reports she continued to have coughing at work, and the nurses at work had encouraged her to be seen.  Denies any history of reactive lung disease, COPD, asthma, or smoking.  Reports that she has had burning chest pain over the last 2 days that worsens with cough, as well as pain in her bilateral upper arms and upper legs.  Reports she has had occasional nausea and vomiting over the last month.  No abdominal pain, diarrhea, urinary symptoms.  Reports some nasal congestion.  Past Medical History:  Diagnosis Date  . Depressed   . Osteoarthritis     There are no active problems to display for this patient.   History reviewed. No pertinent surgical history.   OB History   No obstetric history on file.      Home Medications    Prior to Admission medications   Medication Sig Start Date End Date Taking? Authorizing Provider  benzonatate (TESSALON) 100 MG capsule Take 1 capsule (100 mg total) by mouth every 8 (eight) hours. 01/02/19   Alvira MondaySchlossman, Aleksandr Pellow, MD  Calcium Carbonate-Vitamin D (CALCIUM + D PO) Take 1 tablet by mouth 2 (two) times  daily.     [provider]  diclofenac (VOLTAREN) 75 MG EC tablet Take 1 tablet (75 mg total) by mouth 2 (two) times daily. 05/11/17   Elson AreasSofia, Leslie K, PA-C  ferrous sulfate 325 (65 FE) MG tablet Take 1 tablet (325 mg total) by mouth daily. 01/02/19   Alvira MondaySchlossman, Bellany Elbaum, MD  methocarbamol (ROBAXIN) 500 MG tablet Take 1 tablet (500 mg total) by mouth 2 (two) times daily. 05/11/17   Elson AreasSofia, Leslie K, PA-C  Multiple Vitamin (MULTIVITAMIN WITH MINERALS) TABS Take 1 tablet by mouth daily.    [provider]  predniSONE (DELTASONE) 10 MG tablet Take 4 tablets (40 mg total) by mouth daily for 4 days. 01/02/19 01/06/19  Alvira MondaySchlossman, Graciano Batson, MD  traMADol (ULTRAM) 50 MG tablet Take 1 tablet (50 mg total) by mouth every 8 (eight) hours as needed. 03/20/14   Leona Singletonhekkekandam, Maria T, MD    Family History No family history on file.  Social History Social History   Tobacco Use  . Smoking status: Former Smoker    Types: Cigars  . Smokeless tobacco: Never Used  Substance Use Topics  . Alcohol use: No  . Drug use: No     Allergies   Codeine   Review of Systems Review of Systems  Constitutional: Negative for fever (feels hot and cold).  HENT: Positive for congestion.  Negative for sore throat.   Eyes: Negative for visual disturbance.  Respiratory: Positive for cough and shortness of breath.   Cardiovascular: Negative for chest pain.  Gastrointestinal: Positive for nausea and vomiting. Negative for abdominal pain, constipation and diarrhea.  Genitourinary: Negative for difficulty urinating and dysuria.  Musculoskeletal: Positive for myalgias. Negative for back pain and neck pain.  Skin: Negative for rash.  Neurological: Negative for syncope and headaches.     Physical Exam Updated Vital Signs BP (!) 145/99   Pulse 91   Temp 98.1 F (36.7 C) (Oral)   Resp 14   Ht 5\' 8"  (1.727 m)   Wt 79.4 kg   LMP 12/27/2018   SpO2 100%   BMI 26.61 kg/m   Physical Exam Vitals signs and nursing  note reviewed.  Constitutional:      General: She is not in acute distress.    Appearance: She is well-developed. She is not diaphoretic.  HENT:     Head: Normocephalic and atraumatic.  Eyes:     Conjunctiva/sclera: Conjunctivae normal.  Neck:     Musculoskeletal: Normal range of motion.  Cardiovascular:     Rate and Rhythm: Normal rate and regular rhythm.     Heart sounds: Normal heart sounds. No murmur. No friction rub. No gallop.   Pulmonary:     Effort: Pulmonary effort is normal. No respiratory distress.     Breath sounds: Wheezing and rhonchi present. No rales.  Abdominal:     General: There is no distension.     Palpations: Abdomen is soft.     Tenderness: There is no abdominal tenderness. There is no guarding.  Musculoskeletal:        General: No tenderness.  Skin:    General: Skin is warm and dry.     Findings: No erythema or rash.  Neurological:     Mental Status: She is alert and oriented to person, place, and time.      ED Treatments / Results  Labs (all labs ordered are listed, but only abnormal results are displayed) Labs Reviewed  CBC WITH DIFFERENTIAL/PLATELET - Abnormal; Notable for the following components:      Result Value   RBC 3.68 (*)    Hemoglobin 9.0 (*)    HCT 30.6 (*)    MCH 24.5 (*)    MCHC 29.4 (*)    RDW 17.0 (*)    Platelets 445 (*)    All other components within normal limits  COMPREHENSIVE METABOLIC PANEL - Abnormal; Notable for the following components:   Potassium 3.3 (*)    CO2 21 (*)    Glucose, Bld 105 (*)    All other components within normal limits  D-DIMER, QUANTITATIVE (NOT AT [facility]) - Abnormal; Notable for the following components:   D-Dimer, Quant 0.58 (*)    All other components within normal limits  I-STAT TROPONIN, ED  I-STAT BETA HCG BLOOD, ED ([facility], [facility], [facility] ONLY)  I-STAT TROPONIN, ED    EKG EKG Interpretation  Date/Time:  Monday January 02 2019 08:03:36 EST Ventricular Rate:  92 PR Interval:    QRS  Duration: 81 QT Interval:  339 QTC Calculation: 420 R Axis:   58 Text Interpretation:  Sinus rhythm Probable LVH with secondary repol abnrm ST depr, consider ischemia, inferior leads Baseline wander in lead(s) II III aVR aVL aVF V1 V2 V6 No significant change since last tracing Confirmed by Alvira Monday ([id number]) on 01/02/2019 8:11:49 AM   Radiology Dg Chest 2 View  Result Date: 01/02/2019 CLINICAL DATA:  Chest pain.  Tightness and shortness of breath. EXAM: CHEST - 2 VIEW COMPARISON:  January 02, 2013 FINDINGS: The heart size and mediastinal contours are within normal limits. Both lungs are clear. The visualized skeletal structures are unremarkable. IMPRESSION: No active cardiopulmonary disease. Electronically Signed   By: Gerome Sam III M.D   On: 01/02/2019 09:33   Ct Angio Chest Pe W And/or Wo Contrast  Result Date: 01/02/2019 CLINICAL DATA:  Chest pain. EXAM: CT ANGIOGRAPHY CHEST WITH CONTRAST TECHNIQUE: Multidetector CT imaging of the chest was performed using the standard protocol during bolus administration of intravenous contrast. Multiplanar CT image reconstructions and MIPs were obtained to evaluate the vascular anatomy. CONTRAST:  88mL ISOVUE-370 IOPAMIDOL (ISOVUE-370) INJECTION 76% intravenously COMPARISON:  Radiographs of same day. FINDINGS: Cardiovascular: Satisfactory opacification of the pulmonary arteries to the segmental level. No evidence of pulmonary embolism. Normal heart size. No pericardial effusion. Mediastinum/Nodes: No enlarged mediastinal, hilar, or axillary lymph nodes. Thyroid gland, trachea, and esophagus demonstrate no significant findings. Lungs/Pleura: Lungs are clear. No pleural effusion or pneumothorax. Upper Abdomen: No acute abnormality. Musculoskeletal: No chest wall abnormality. No acute or significant osseous findings. Review of the MIP images confirms the above findings. IMPRESSION: No definite evidence of pulmonary embolus. No significant abnormality  seen in the chest. Electronically Signed   By: Lupita Raider, M.D.   On: 01/02/2019 10:18    Procedures Procedures (including critical care time)  Medications Ordered in ED Medications  potassium chloride SA (K-DUR,KLOR-CON) CR tablet 40 mEq (has no administration in time range)  albuterol (PROVENTIL HFA;VENTOLIN HFA) 108 (90 Base) MCG/ACT inhaler 2 puff (has no administration in time range)  predniSONE (DELTASONE) tablet 60 mg (60 mg Oral Given 01/02/19 0825)  ipratropium-albuterol (DUONEB) 0.5-2.5 (3) MG/3ML nebulizer solution 3 mL (3 mLs Nebulization Given 01/02/19 0825)  iopamidol (ISOVUE-370) 76 % injection 100 mL (65 mLs Intravenous Contrast Given 01/02/19 0939)  ketorolac (TORADOL) 15 MG/ML injection 15 mg (15 mg Intravenous Given 01/02/19 1028)     Initial Impression / Assessment and Plan / ED Course  I have reviewed the triage vital signs and the nursing notes.  Pertinent labs & imaging results that were available during my care of the patient were reviewed by me and considered in my medical decision making (see chart for details).    48 year old female with a history of arthritis presents with concern for shortness of breath and cough for a month and a half, and 2 days of chest pain.  DDx includes viral syndrome with bronchitis and RAD, ACS, CHF, PE, pneumonia.  EKG shows no significant abnormalities. CXR shows no abnormalities. DDimer positive and CT PE study done showing no evidence of PE or pneumoina. Troponin negative with over one day of continuous chest pain and doubt ACS.  Given cough productive of mucus predominate symptom and wheezing on exam, suspect bronchitis with RAD as etiology of continuing symptoms.  Given prednisone, albuterol.   In addition, labs show anemia with hgb of 9. Unclear chronicity, with last hgb 5 years ago---patient does report being on iron tablets in the past and having past work up with GI, and suspect this is not acute by hx. She does not describe new  onset bleeding on hx, does report hx of menorrhagia.  Recommend continued outpatient work up for anemia with PCP initially, possibly secondary to menorrhagia, possible chronic GI losses. Will start empiric ferrous sulfate.  Patient discharged in stable condition with understanding of reasons  to return.   Final Clinical Impressions(s) / ED Diagnoses   Final diagnoses:  Bronchitis  Chest pain, unspecified type  Anemia, unspecified type    ED Discharge Orders         Ordered    ferrous sulfate 325 (65 FE) MG tablet  Daily     01/02/19 1046    predniSONE (DELTASONE) 10 MG tablet  Daily     01/02/19 1046    benzonatate (TESSALON) 100 MG capsule  Every 8 hours     01/02/19 1046           Alvira MondaySchlossman, Wanya Bangura, MD 01/02/19 1052

## 2019-01-02 NOTE — Discharge Instructions (Addendum)
Use the inhaler 2 puffs every 4 hours as needed for cough and shortness of breath.  Follow up with a primary care physician regarding your anemia and if your symptoms do not improve.

## 2019-01-02 NOTE — ED Triage Notes (Addendum)
Pt arrives POV with complaints of chest pain that began yesterday. Pt states its a burning feeling with tightness and SOB. 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

## 2019-01-02 NOTE — ED Notes (Signed)
Patient transported to CT 

## 2019-06-28 ENCOUNTER — Ambulatory Visit: Payer: PRIVATE HEALTH INSURANCE | Admitting: Family Medicine

## 2019-08-08 ENCOUNTER — Ambulatory Visit: Payer: PRIVATE HEALTH INSURANCE | Attending: Nurse Practitioner | Admitting: Nurse Practitioner

## 2019-08-08 ENCOUNTER — Encounter: Payer: Self-pay | Admitting: Nurse Practitioner

## 2019-08-08 ENCOUNTER — Other Ambulatory Visit: Payer: Self-pay

## 2019-08-08 VITALS — Ht 67.0 in | Wt 162.0 lb

## 2019-08-08 DIAGNOSIS — N920 Excessive and frequent menstruation with regular cycle: Secondary | ICD-10-CM

## 2019-08-08 DIAGNOSIS — Z131 Encounter for screening for diabetes mellitus: Secondary | ICD-10-CM

## 2019-08-08 DIAGNOSIS — D500 Iron deficiency anemia secondary to blood loss (chronic): Secondary | ICD-10-CM | POA: Diagnosis not present

## 2019-08-08 DIAGNOSIS — Z1329 Encounter for screening for other suspected endocrine disorder: Secondary | ICD-10-CM

## 2019-08-08 DIAGNOSIS — Z130 Encounter for screening for diseases of the blood and blood-forming organs and certain disorders involving the immune mechanism: Secondary | ICD-10-CM

## 2019-08-08 DIAGNOSIS — E559 Vitamin D deficiency, unspecified: Secondary | ICD-10-CM | POA: Diagnosis not present

## 2019-08-08 DIAGNOSIS — Z7689 Persons encountering health services in other specified circumstances: Secondary | ICD-10-CM

## 2019-08-08 DIAGNOSIS — Z1211 Encounter for screening for malignant neoplasm of colon: Secondary | ICD-10-CM

## 2019-08-08 DIAGNOSIS — D649 Anemia, unspecified: Secondary | ICD-10-CM

## 2019-08-08 DIAGNOSIS — Z13220 Encounter for screening for lipoid disorders: Secondary | ICD-10-CM

## 2019-08-08 MED ORDER — FERROUS SULFATE 325 (65 FE) MG PO TABS
325.0000 mg | ORAL_TABLET | Freq: Every day | ORAL | 1 refills | Status: AC
Start: 2019-08-08 — End: 2019-11-06

## 2019-08-08 NOTE — Progress Notes (Signed)
Virtual Visit via Telephone Note Due to national recommendations of social distancing due to [place] 19, telehealth visit is felt to be most appropriate for this patient at this time.  I discussed the limitations, risks, security and privacy concerns of performing an evaluation and management service by telephone and the availability of in person appointments. I also discussed with the patient that there may be a patient responsible charge related to this service. The patient expressed understanding and agreed to proceed.    I connected with Sheila Chambers on 08/08/19  at   8:50 AM EDT  EDT by telephone and verified that I am speaking with the correct person using two identifiers.   Consent I discussed the limitations, risks, security and privacy concerns of performing an evaluation and management service by telephone and the availability of in person appointments. I also discussed with the patient that there may be a patient responsible charge related to this service. The patient expressed understanding and agreed to proceed.   Location of Patient: Private  Residence   Location of Provider: [facility] [employer] Office    Persons participating in Telemedicine visit: Sheila Rankins FNP-BC [place]    History of Present Illness: Telemedicine visit for: Establish care   has a past medical history of Anemia, Depressed, and Osteoarthritis.  She has not seen a PCP in over 2 years. She has never had a mammogram or colonoscopy. Last PAP smear several years ago. She is currently living with her son and states she would be homeless if she were not living there.  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 Denies irregular cycles. She is taking an iron tablet once daily for IDA. Will need CBC. Denies chest pain, shortness of breath, palpitations, lightheadedness, dizziness, headaches or BLE edema.     Past Medical History:  Diagnosis Date  . Anemia   .  Depressed   . Osteoarthritis     Past Surgical History:  Procedure Laterality Date  . Right arm skin draft      Family History  Problem Relation Age of Onset  . Diabetes Sister   . Hypertension Neg Hx     Social History   Socioeconomic History  . Marital status: Single    Spouse name: Not on file  . Number of children: Not on file  . Years of education: Not on file  . Highest education level: Not on file  Occupational History  . Not on file  Social Needs  . Financial resource strain: Not on file  . Food insecurity    Worry: Not on file    Inability: Not on file  . Transportation needs    Medical: Not on file    Non-medical: Not on file  Tobacco Use  . Smoking status: Former Smoker    Types: Cigars  . Smokeless tobacco: Never Used  Substance and Sexual Activity  . Alcohol use: No  . Drug use: Not Currently    Types: Marijuana  . Sexual activity: Yes  Lifestyle  . Physical activity    Days per week: Not on file    Minutes per session: Not on file  . Stress: Not on file  Relationships  . Social Herbalist on phone: Not on file    Gets together: Not on file    Attends religious service: Not on file    Active member of club or organization: Not on file    Attends meetings  of clubs or organizations: Not on file    Relationship status: Not on file  Other Topics Concern  . Not on file  Social History Narrative  . Not on file     Observations/Objective: Awake, alert and oriented x 3   Review of Systems  Constitutional: Negative for fever, malaise/fatigue and weight loss.  HENT: Negative.  Negative for nosebleeds.   Eyes: Negative.  Negative for blurred vision, double vision and photophobia.  Respiratory: Negative.  Negative for cough and shortness of breath.   Cardiovascular: Negative.  Negative for chest pain, palpitations and leg swelling.  Gastrointestinal: Negative.  Negative for heartburn, nausea and vomiting.  Musculoskeletal: Negative.   Negative for myalgias.  Neurological: Negative.  Negative for dizziness, focal weakness, seizures and headaches.  Psychiatric/Behavioral: Negative.  Negative for suicidal ideas.    Assessment and Plan  Diagnoses and all orders for this visit:  Encounter to establish care  Diabetes mellitus screening -     Hemoglobin A1c; Future  Screening for deficiency anemia -     CBC; Future  Lipid screening -     Lipid panel; Future  Thyroid disorder screening -     TSH; Future  Vitamin D deficiency disease -     VITAMIN D 25 Hydroxy (Vit-D Deficiency, Fractures); Future  Colon cancer screening -     Fecal occult blood, imunochemical; Future  Anemia, unspecified type -     CMP14+EGFR; Future -     ferrous sulfate 325 (65 FE) MG tablet; Take 1 tablet (325 mg total) by mouth daily.     Follow Up Instructions Return for PAP SMEAR.     I discussed the assessment and treatment plan with the patient. The patient was provided an opportunity to ask questions and all were answered. The patient agreed with the plan and demonstrated an understanding of the instructions.   The patient was advised to call back or seek an in-person evaluation if the symptoms worsen or if the condition fails to improve as anticipated.  I provided 21 minutes of non-face-to-face time during this encounter including median intraservice time, reviewing previous notes, labs, imaging, medications and explaining diagnosis and management.  Gildardo Pounds, FNP-BC

## 2019-08-11 ENCOUNTER — Other Ambulatory Visit: Payer: Self-pay

## 2019-08-11 ENCOUNTER — Ambulatory Visit: Payer: PRIVATE HEALTH INSURANCE | Attending: Family Medicine

## 2019-08-11 DIAGNOSIS — Z130 Encounter for screening for diseases of the blood and blood-forming organs and certain disorders involving the immune mechanism: Secondary | ICD-10-CM

## 2019-08-11 DIAGNOSIS — Z1211 Encounter for screening for malignant neoplasm of colon: Secondary | ICD-10-CM

## 2019-08-11 DIAGNOSIS — E559 Vitamin D deficiency, unspecified: Secondary | ICD-10-CM

## 2019-08-11 DIAGNOSIS — Z131 Encounter for screening for diabetes mellitus: Secondary | ICD-10-CM

## 2019-08-11 DIAGNOSIS — Z1329 Encounter for screening for other suspected endocrine disorder: Secondary | ICD-10-CM

## 2019-08-11 DIAGNOSIS — D649 Anemia, unspecified: Secondary | ICD-10-CM

## 2019-08-11 DIAGNOSIS — Z13220 Encounter for screening for lipoid disorders: Secondary | ICD-10-CM

## 2019-08-12 LAB — CMP14+EGFR
ALT: 14 IU/L (ref 0–32)
AST: 14 IU/L (ref 0–40)
Albumin/Globulin Ratio: 1.3 (ref 1.2–2.2)
Albumin: 4.3 g/dL (ref 3.8–4.8)
Alkaline Phosphatase: 95 IU/L (ref 39–117)
BUN/Creatinine Ratio: 16 (ref 9–23)
BUN: 13 mg/dL (ref 6–24)
Bilirubin Total: 0.3 mg/dL (ref 0.0–1.2)
CO2: 22 mmol/L (ref 20–29)
Calcium: 9.5 mg/dL (ref 8.7–10.2)
Chloride: 104 mmol/L (ref 96–106)
Creatinine, Ser: 0.83 mg/dL (ref 0.57–1.00)
GFR calc Af Amer: 95 mL/min/{1.73_m2} (ref >59)
GFR calc non Af Amer: 82 mL/min/{1.73_m2} (ref >59)
Globulin, Total: 3.4 g/dL (ref 1.5–4.5)
Glucose: 106 mg/dL — ABNORMAL HIGH (ref 65–99)
Potassium: 4.1 mmol/L (ref 3.5–5.2)
Sodium: 140 mmol/L (ref 134–144)
Total Protein: 7.7 g/dL (ref 6.0–8.5)

## 2019-08-12 LAB — CBC
Hematocrit: 40 % (ref 34.0–46.6)
Hemoglobin: 13.2 g/dL (ref 11.1–15.9)
MCH: 30.3 pg (ref 26.6–33.0)
MCHC: 33 g/dL (ref 31.5–35.7)
MCV: 92 fL (ref 79–97)
Platelets: 338 10*3/uL (ref 150–450)
RBC: 4.36 x10E6/uL (ref 3.77–5.28)
RDW: 13.6 % (ref 11.7–15.4)
WBC: 6.8 10*3/uL (ref 3.4–10.8)

## 2019-08-12 LAB — HEMOGLOBIN A1C
Est. average glucose Bld gHb Est-mCnc: 94 mg/dL
Hgb A1c MFr Bld: 4.9 % (ref 4.8–5.6)

## 2019-08-12 LAB — VITAMIN D 25 HYDROXY (VIT D DEFICIENCY, FRACTURES): Vit D, 25-Hydroxy: 27.9 ng/mL — ABNORMAL LOW (ref 30.0–100.0)

## 2019-08-12 LAB — LIPID PANEL
Chol/HDL Ratio: 4.4 ratio (ref 0.0–4.4)
Cholesterol, Total: 191 mg/dL (ref 100–199)
HDL: 43 mg/dL (ref >39)
LDL Chol Calc (NIH): 120 mg/dL — ABNORMAL HIGH (ref 0–99)
Triglycerides: 158 mg/dL — ABNORMAL HIGH (ref 0–149)
VLDL Cholesterol Cal: 28 mg/dL (ref 5–40)

## 2019-08-12 LAB — TSH: TSH: 0.268 u[IU]/mL — ABNORMAL LOW (ref 0.450–4.500)

## 2019-08-31 ENCOUNTER — Ambulatory Visit: Payer: PRIVATE HEALTH INSURANCE | Attending: Nurse Practitioner

## 2019-08-31 ENCOUNTER — Other Ambulatory Visit: Payer: Self-pay

## 2019-08-31 DIAGNOSIS — Z1329 Encounter for screening for other suspected endocrine disorder: Secondary | ICD-10-CM

## 2019-09-01 ENCOUNTER — Other Ambulatory Visit: Payer: Self-pay | Admitting: Nurse Practitioner

## 2019-09-01 DIAGNOSIS — R7989 Other specified abnormal findings of blood chemistry: Secondary | ICD-10-CM

## 2019-09-01 LAB — TSH: TSH: 0.271 u[IU]/mL — ABNORMAL LOW (ref 0.450–4.500)

## 2019-09-06 ENCOUNTER — Telehealth: Payer: Self-pay | Admitting: Nurse Practitioner

## 2019-09-06 LAB — T4, FREE: Free T4: 1.53 ng/dL (ref 0.82–1.77)

## 2019-09-06 LAB — T3: T3, Total: 121 ng/dL (ref 71–180)

## 2019-09-06 LAB — SPECIMEN STATUS REPORT

## 2019-09-06 NOTE — Telephone Encounter (Signed)
Patient called wanting to get her lab results. Please follow up.  °

## 2019-09-07 NOTE — Telephone Encounter (Signed)
Attempt to reach patient to inform on lab results. No answer and LVM.  

## 2019-09-08 ENCOUNTER — Other Ambulatory Visit: Payer: PRIVATE HEALTH INSURANCE | Admitting: Nurse Practitioner

## 2019-10-20 ENCOUNTER — Other Ambulatory Visit: Payer: Self-pay | Admitting: Nurse Practitioner

## 2019-10-30 IMAGING — CT CT ANGIO CHEST
2 of 8 series · 18 of 46 positions shown · IV contrast (iopamidol)
Comparison: Radiographs of same day.

CLINICAL DATA: Chest pain.

EXAM:
CT ANGIOGRAPHY CHEST WITH CONTRAST
TECHNIQUE: Multidetector CT imaging of the chest was performed using the
standard protocol during bolus administration of intravenous
contrast. Multiplanar CT image reconstructions and MIPs were
obtained to evaluate the vascular anatomy.
CONTRAST:  65mL [id number] IOPAMIDOL ([id number]) INJECTION 76%
intravenously

[Series 6: thins · axial · 0.70mm/px · z∈[+1140,+1346]mm · 15 of 228 slices shown]
[im 11/228  lung]
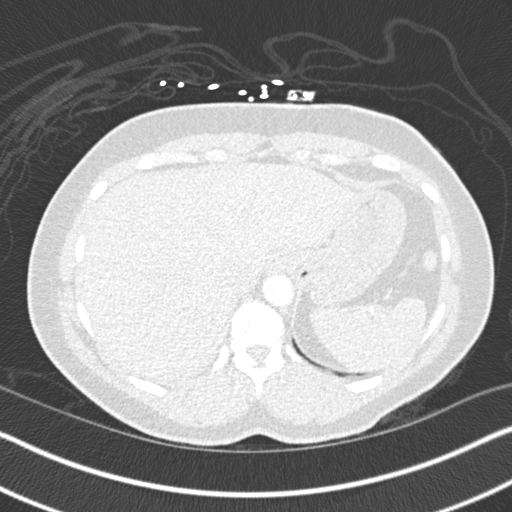
[im 31/228  soft-tissue]
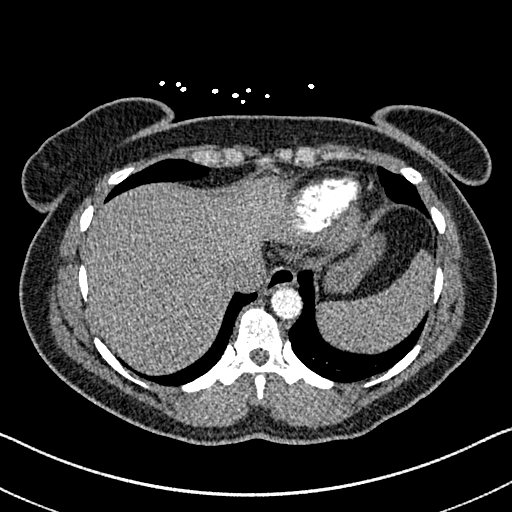
[im 42/228  lung]
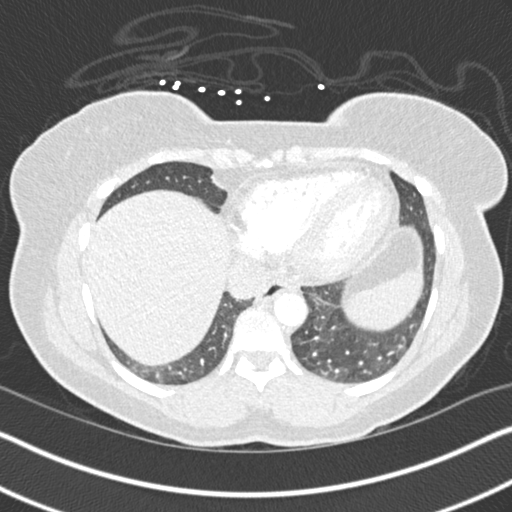
[im 52/228  soft-tissue]
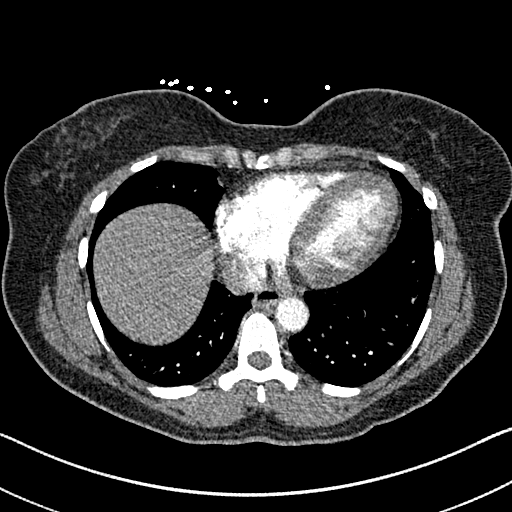
[im 73/228  lung]
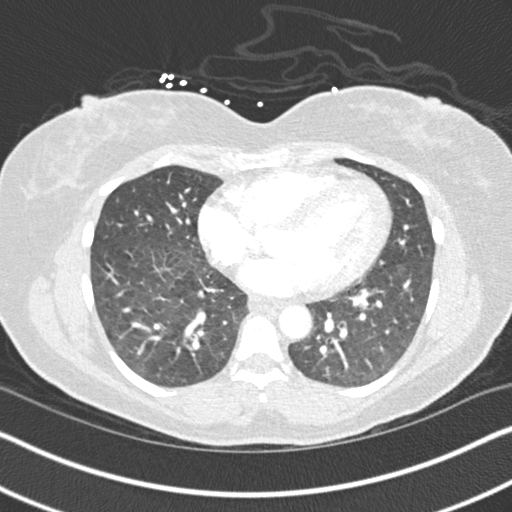
[im 83/228  soft-tissue]
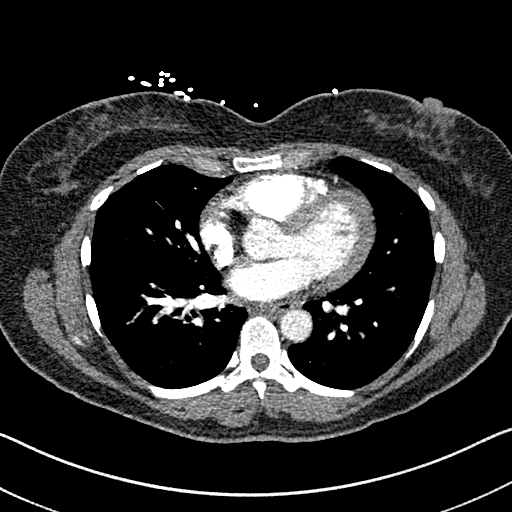
[im 104/228  lung]
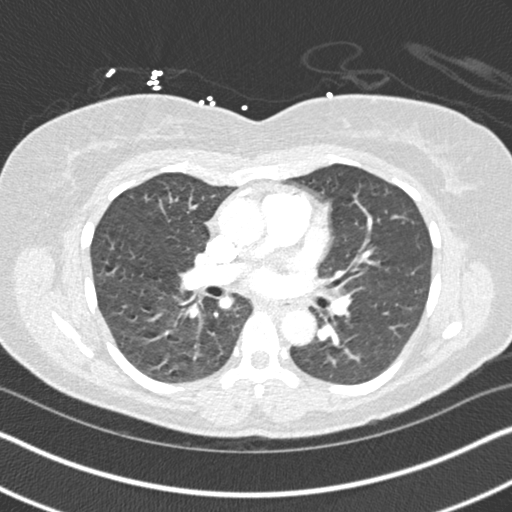
[im 114/228  soft-tissue]
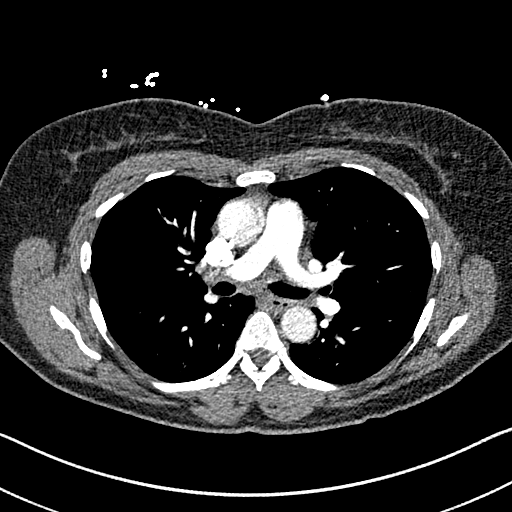
[im 124/228  lung]
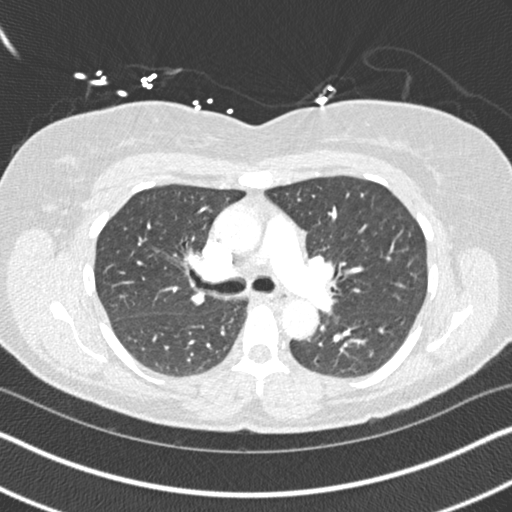
[im 145/228  soft-tissue]
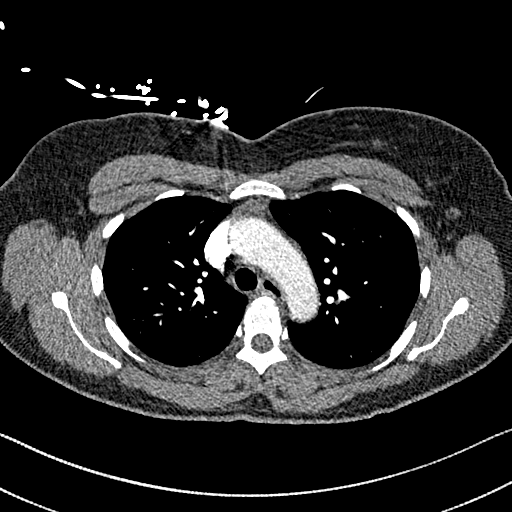
[im 155/228  lung]
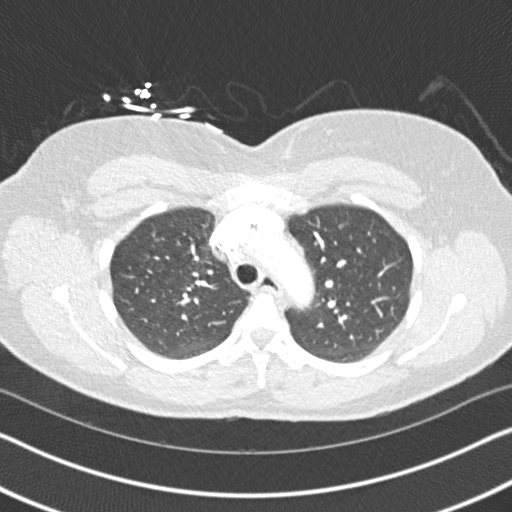
[im 176/228  soft-tissue]
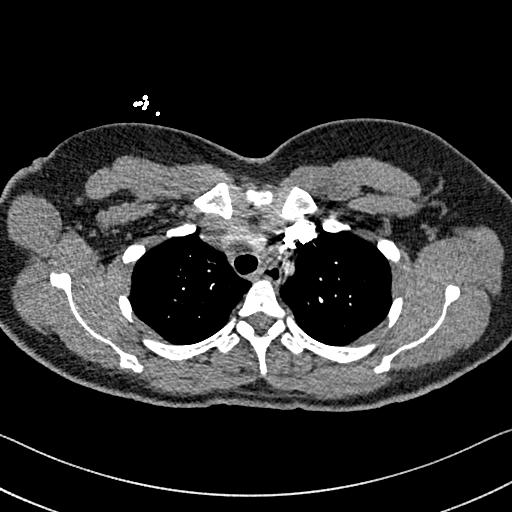
[im 186/228  lung]
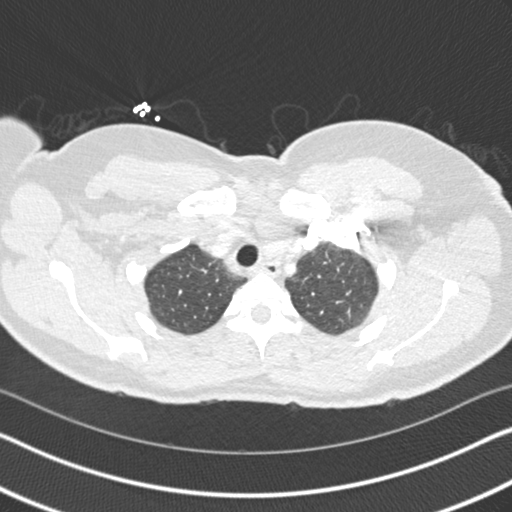
[im 197/228  soft-tissue]
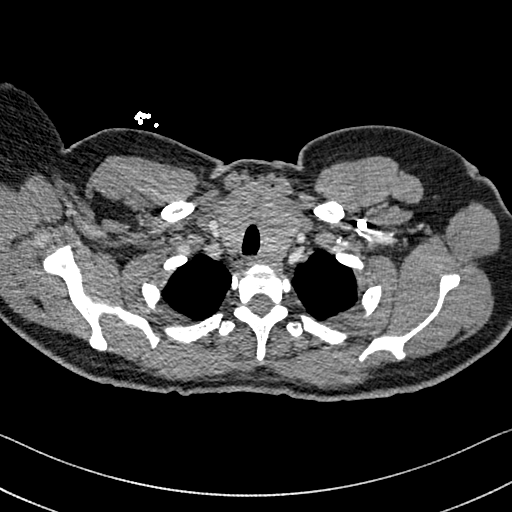
[im 217/228  lung]
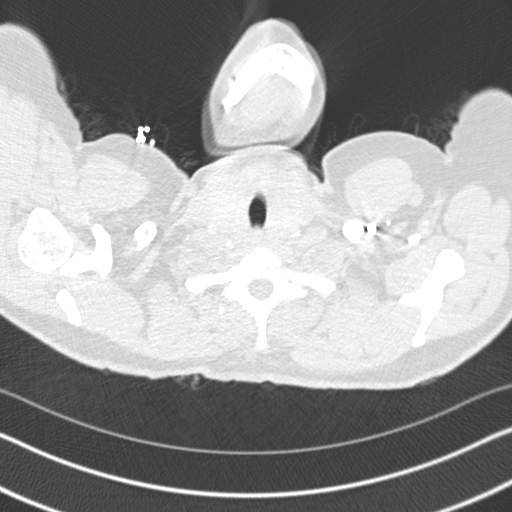

[Series 8: coronal mpr · coronal · 0.46mm/px · 3 of 151 slices shown]
[im 38/151  soft-tissue]
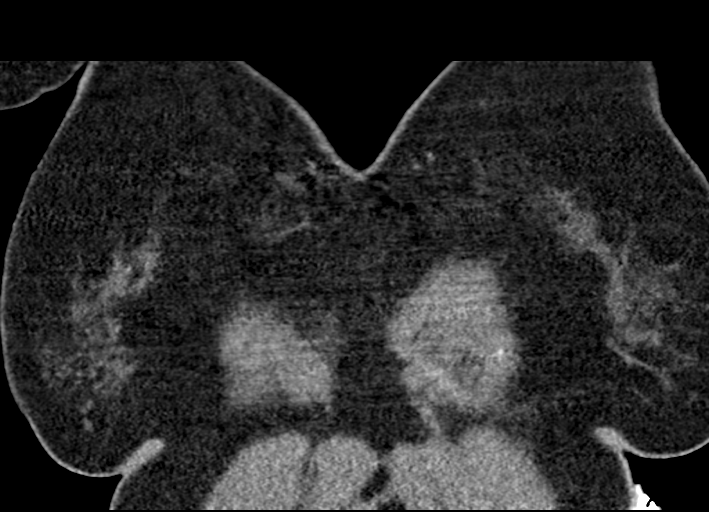
[im 76/151  soft-tissue]
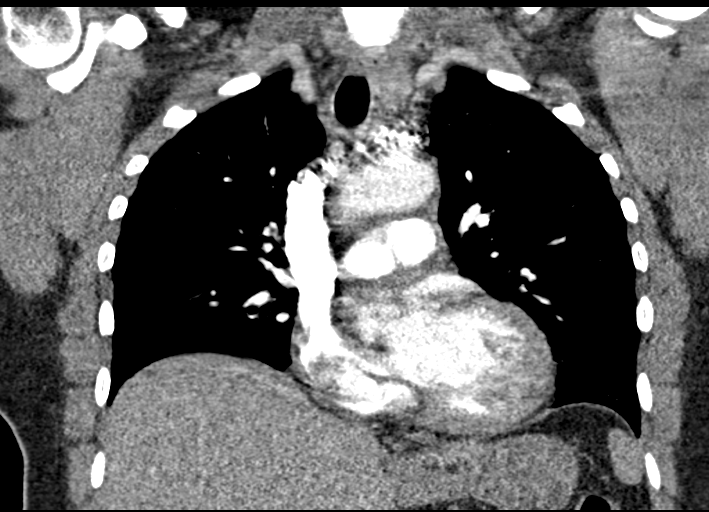
[im 113/151  soft-tissue]
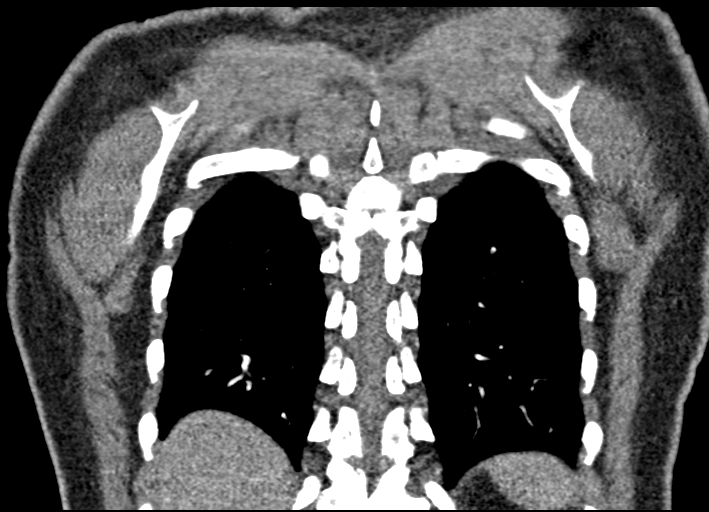

[18 of 46 positions shown; findings below may reference images not displayed]

FINDINGS: Cardiovascular: Satisfactory opacification of the pulmonary arteries
to the segmental level. No evidence of pulmonary embolism. Normal
heart size. No pericardial effusion.

Mediastinum/Nodes: No enlarged mediastinal, hilar, or axillary lymph
nodes. Thyroid gland, trachea, and esophagus demonstrate no
significant findings.

Lungs/Pleura: Lungs are clear. No pleural effusion or pneumothorax.

Upper Abdomen: No acute abnormality.

Musculoskeletal: No chest wall abnormality. No acute or significant
osseous findings.

Review of the MIP images confirms the above findings.
IMPRESSION: No definite evidence of pulmonary embolus. No significant
abnormality seen in the chest.

## 2022-02-13 ENCOUNTER — Encounter (HOSPITAL_COMMUNITY): Payer: Self-pay

## 2022-02-13 ENCOUNTER — Emergency Department (HOSPITAL_COMMUNITY): Payer: BLUE CROSS/BLUE SHIELD

## 2022-02-13 ENCOUNTER — Emergency Department (HOSPITAL_COMMUNITY)
Admission: EM | Admit: 2022-02-13 | Discharge: 2022-02-13 | Disposition: A | Discharge status: Home / Self Care | Payer: BLUE CROSS/BLUE SHIELD | Attending: Emergency Medicine | Admitting: Emergency Medicine

## 2022-02-13 ENCOUNTER — Other Ambulatory Visit: Payer: Self-pay

## 2022-02-13 DIAGNOSIS — H53149 Visual discomfort, unspecified: Secondary | ICD-10-CM | POA: Diagnosis not present

## 2022-02-13 DIAGNOSIS — R110 Nausea: Secondary | ICD-10-CM | POA: Insufficient documentation

## 2022-02-13 DIAGNOSIS — R519 Headache, unspecified: Secondary | ICD-10-CM | POA: Insufficient documentation

## 2022-02-13 HISTORY — DX: Essential (primary) hypertension: I10

## 2022-02-13 HISTORY — DX: Fibromyalgia: M79.7

## 2022-02-13 MED ORDER — DIPHENHYDRAMINE HCL 50 MG/ML IJ SOLN
25.0000 mg | Freq: Once | INTRAMUSCULAR | Status: AC
  Administered 2022-02-13: 25 mg via INTRAVENOUS
  Filled 2022-02-13: qty 1

## 2022-02-13 MED ORDER — BUTALBITAL-APAP-CAFFEINE 50-325-40 MG PO TABS: 1.0000 | ORAL_TABLET | Freq: Three times a day (TID) | ORAL | 0 refills | Status: AC | PRN

## 2022-02-13 MED ORDER — METOCLOPRAMIDE HCL 5 MG/ML IJ SOLN
5.0000 mg | Freq: Once | INTRAMUSCULAR | Status: AC
  Administered 2022-02-13: 5 mg via INTRAVENOUS
  Filled 2022-02-13: qty 2

## 2022-02-13 MED ORDER — BUTALBITAL-APAP-CAFFEINE 50-325-40 MG PO TABS: 1.0000 | ORAL_TABLET | Freq: Four times a day (QID) | ORAL | 0 refills | Status: DC | PRN

## 2022-02-13 MED ORDER — SODIUM CHLORIDE 0.9 % IV BOLUS
500.0000 mL | Freq: Once | INTRAVENOUS | Status: AC
  Administered 2022-02-13: 500 mL via INTRAVENOUS

## 2022-02-13 MED ORDER — DEXAMETHASONE SODIUM PHOSPHATE 10 MG/ML IJ SOLN
10.0000 mg | Freq: Once | INTRAMUSCULAR | Status: AC
  Administered 2022-02-13: 10 mg via INTRAVENOUS
  Filled 2022-02-13: qty 1

## 2022-02-13 NOTE — ED Notes (Signed)
I provided reinforced discharge education based off of discharge instructions. Pt acknowledged and understood my education. Pt had no further questions/concerns for provider/myself.  °

## 2022-02-13 NOTE — ED Provider Notes (Signed)
?[facility]-EMERGENCY DEPT ?Provider Note ? ? ?CSN: [id number] ?Arrival date & time: 02/13/22  1014 ? ?  ? ?History ? ?Chief Complaint  ?Patient presents with  ? Headache  ? ? ?Sheila Chambers is a 51 y.o. female who presents emergency department with a chief complaint of headache.  Patient states that about 6 weeks ago she developed a frontal headache.  It has been intractable since that time and over the past 3 weeks has severely worsened.  She has complaints of associated photophobia and nausea developing yesterday.  She saw her primary care doctor 2 days ago had a shot of Toradol without improvement in her headache.  She has been using over-the-counter Motrin and Tylenol for relief of symptoms without any significant improvement.  She denies visual disturbances, unilateral weakness, fevers.  She has no other neurologic complaints. ? ? ?Headache ? ?  ? ?Home Medications ?Prior to Admission medications   ?Medication Sig Start Date End Date Taking? Authorizing Provider  ?butalbital-acetaminophen-caffeine (FIORICET) 50-325-40 MG tablet Take 1-2 tablets by mouth every 8 (eight) hours as needed for headache. 02/13/22 02/13/23  Arthor Captain, PA-C  ?Calcium Carbonate-Vitamin D (CALCIUM + D PO) Take 1 tablet by mouth 2 (two) times daily.     [provider]  ?ferrous sulfate 325 (65 FE) MG tablet Take 1 tablet (325 mg total) by mouth daily. 08/08/19 11/06/19  Claiborne Rigg, NP  ?Multiple Vitamin (MULTIVITAMIN WITH MINERALS) TABS Take 1 tablet by mouth daily.    [provider]  ?   ? ?Allergies    ?Codeine   ? ?Review of Systems   ?Review of Systems  ?Neurological:  Positive for headaches.  ? ?Physical Exam ?Updated Vital Signs ?BP (!) 147/103 (BP Location: Right Arm)   Pulse 80   Temp 97.9 ?F (36.6 ?C) (Oral)   Resp 18   Ht 5\' 7"  (1.702 m)   Wt 68.5 kg   SpO2 100%   BMI 23.65 kg/m?  ?Physical Exam ?Physical Exam  ?Constitutional: Pt is oriented to person, place, and time. Pt  appears well-developed and well-nourished. No distress.  ?HENT:  ?Head: Normocephalic and atraumatic.  ?Mouth/Throat: Oropharynx is clear and moist.  ?Eyes: Conjunctivae and EOM are normal. Pupils are equal, round, and reactive to light. No scleral icterus.  ?No horizontal, vertical or rotational nystagmus  ?Neck: Normal range of motion. Neck supple.  ?Full active and passive ROM without pain ?No midline or paraspinal tenderness ?No nuchal rigidity or meningeal signs  ?Cardiovascular: Normal rate, regular rhythm and intact distal pulses.   ?Pulmonary/Chest: Effort normal and breath sounds normal. No respiratory distress. Pt has no wheezes. No rales.  ?Abdominal: Soft. Bowel sounds are normal. There is no tenderness. There is no rebound and no guarding.  ?Musculoskeletal: Normal range of motion.  ?Lymphadenopathy:  ?  No cervical adenopathy.  ?Neurological: Pt. is alert and oriented to person, place, and time. He has normal reflexes. No cranial nerve deficit.  Exhibits normal muscle tone. Coordination normal.  ?Mental Status:  ?Alert, oriented, thought content appropriate. Speech fluent without evidence of aphasia. Able to follow 2 step commands without difficulty.  ?Cranial Nerves:  ?II:  Peripheral visual fields grossly normal, pupils equal, round, reactive to light ?III,IV, VI: ptosis not present, extra-ocular motions intact bilaterally  ?V,VII: smile symmetric, facial light touch sensation equal ?VIII: hearing grossly normal bilaterally  ?IX,X: midline uvula rise  ?XI: bilateral shoulder shrug equal and strong ?XII: midline tongue extension  ?Motor:  ?5/5  in upper and lower extremities bilaterally including strong and equal grip strength and dorsiflexion/plantar flexion ?Sensory: Pinprick and light touch normal in all extremities.  ?Deep Tendon Reflexes: 2+ and symmetric  ?Cerebellar: normal finger-to-nose with bilateral upper extremities ?Gait: normal gait and balance ?CV: distal pulses palpable throughout    ?Skin: Skin is warm and dry. No rash noted. Pt is not diaphoretic.  ?Psychiatric: Pt has a normal mood and affect. Behavior is normal. Judgment and thought content normal.  ?Nursing note and vitals reviewed. ? ?ED Results / Procedures / Treatments   ?Labs ?(all labs ordered are listed, but only abnormal results are displayed) ?Labs Reviewed - No data to display ? ?EKG ?None ? ?Radiology ?CT Head Wo Contrast ? ?Result Date: 02/13/2022 ?CLINICAL DATA:  Headache, new or worsening. Headache with light sensitivity. EXAM: CT HEAD WITHOUT CONTRAST TECHNIQUE: Contiguous axial images were obtained from the base of the skull through the vertex without intravenous contrast. RADIATION DOSE REDUCTION: This exam was performed according to the departmental dose-optimization program which includes automated exposure control, adjustment of the mA and/or kV according to patient size and/or use of iterative reconstruction technique. COMPARISON:  CT head dated September 30, 2012 FINDINGS: Brain: No evidence of acute infarction, hemorrhage, hydrocephalus, extra-axial collection or mass lesion/mass effect. Vascular: No hyperdense vessel or unexpected calcification. Skull: Normal. Negative for fracture or focal lesion. Sinuses/Orbits: Mucosal thickening of ethmoid air cells. Orbits are unremarkable. Other: None. IMPRESSION: No acute intracranial abnormality. Electronically Signed   By: Larose HiresImran  Ahmed D.O.   On: 02/13/2022 11:21   ? ?Procedures ?Procedures  ? ? ?Medications Ordered in ED ?Medications  ?sodium chloride 0.9 % bolus 500 mL (0 mLs Intravenous Stopped 02/13/22 1309)  ?dexamethasone (DECADRON) injection 10 mg (10 mg Intravenous Given 02/13/22 1121)  ?metoCLOPramide (REGLAN) injection 5 mg (5 mg Intravenous Given 02/13/22 1121)  ?diphenhydrAMINE (BENADRYL) injection 25 mg (25 mg Intravenous Given 02/13/22 1121)  ? ? ?ED Course/ Medical Decision Making/ A&P ?  ?                        ?Medical Decision Making ?Sheila Chambers presents with  headache ?Given the large differential diagnosis for Sheila BoysDiane Chambers, the decision making in this case is of high complexity. ? ?After evaluating all of the data points in this case, the presentation of Sheila Chambers is NOT consistent with skull fracture, meningitis/encephalitis, SAH/sentinel bleed, Intracranial Hemorrhage (ICH) (subdural/epidural), acute obstructive hydrocephalus, space occupying lesions, CVA, CO Poisoning, Basilar/vertebral artery dissection, preeclampsia, cerebral venous thrombosis, hypertensive emergency, temporal Arteritis, Idiopathic Intracranial Hypertension (pseudotumor cerebri). ? ?Strict return and follow-up precautions have been given by me personally or by detailed written instructions verbalized by nursing staff using the teach back method to patient/family/caregiver. ? ?Data Reviewed/Counseling: I have reviewed the patient's vital signs, nursing notes, and other relevant tests/information. I had a detailed discussion regarding the historical points, exam findings, and any diagnostic results supporting the discharge diagnosis. I also discussed the need for outpatient follow-up and the need to return to the ED if symptoms worsen or if there are any questions or concerns that arise at hom ? ? ? ?Problems Addressed: ?Bad headache: undiagnosed new problem with uncertain prognosis ? ?Amount and/or Complexity of Data Reviewed ?Radiology: ordered and independent interpretation performed. ?   Details: I visualized a head CT which shows no acute findings ? ?Risk ?Prescription drug management. ? ? ? ?Final Clinical Impression(s) / ED Diagnoses ?Final diagnoses:  ?Bad headache  ? ? ?  Rx / DC Orders ?ED Discharge Orders   ? ?      Ordered  ?  butalbital-acetaminophen-caffeine (FIORICET) 50-325-40 MG tablet  Every 6 hours PRN,   Status:  Discontinued       ? 02/13/22 1253  ?  butalbital-acetaminophen-caffeine (FIORICET) 50-325-40 MG tablet  Every 8 hours PRN       ? 02/13/22 1255  ?  Ambulatory referral  to Neurology       ?Comments: An appointment is requested in approximately: 4 weeks ? ?Intractable headache  ? 02/13/22 1256  ? ?  ?  ? ?  ? ? ?  ?Arthor Captain, PA-C ?02/14/22 9735 ? ?  ?Derwood Kaplan, MD ?0

## 2022-02-13 NOTE — ED Triage Notes (Signed)
Patient c/o x 3 weeks. Patient states she went to her PCP for the same and was told it was hypertension. ?Patient states intermittent blurred vision and intermittent nausea. ?

## 2022-02-13 NOTE — Discharge Instructions (Addendum)

## 2022-03-23 ENCOUNTER — Inpatient Hospital Stay: Payer: Self-pay | Admitting: Psychiatry

## 2022-12-24 ENCOUNTER — Emergency Department (HOSPITAL_COMMUNITY): Payer: 59

## 2022-12-24 ENCOUNTER — Other Ambulatory Visit: Payer: Self-pay

## 2022-12-24 ENCOUNTER — Emergency Department (HOSPITAL_COMMUNITY)
Admission: EM | Admit: 2022-12-24 | Discharge: 2022-12-24 | Disposition: A | Discharge status: Home / Self Care | Payer: 59 | Attending: Emergency Medicine | Admitting: Emergency Medicine

## 2022-12-24 ENCOUNTER — Encounter (HOSPITAL_COMMUNITY): Payer: Self-pay

## 2022-12-24 DIAGNOSIS — R079 Chest pain, unspecified: Secondary | ICD-10-CM | POA: Diagnosis not present

## 2022-12-24 LAB — BASIC METABOLIC PANEL
Anion gap: 9 (ref 5–15)
BUN: 15 mg/dL (ref 6–20)
CO2: 28 mmol/L (ref 22–32)
Calcium: 9.9 mg/dL (ref 8.9–10.3)
Chloride: 103 mmol/L (ref 98–111)
Creatinine, Ser: 0.72 mg/dL (ref 0.44–1.00)
GFR, Estimated: >60 mL/min (ref >60)
Glucose, Bld: 90 mg/dL (ref 70–99)
Potassium: 3.8 mmol/L (ref 3.5–5.1)
Sodium: 140 mmol/L (ref 135–145)

## 2022-12-24 LAB — CBC
HCT: 40.1 % (ref 36.0–46.0)
Hemoglobin: 13.1 g/dL (ref 12.0–15.0)
MCH: 31.7 pg (ref 26.0–34.0)
MCHC: 32.7 g/dL (ref 30.0–36.0)
MCV: 97.1 fL (ref 80.0–100.0)
Platelets: 376 10*3/uL (ref 150–400)
RBC: 4.13 MIL/uL (ref 3.87–5.11)
RDW: 12.9 % (ref 11.5–15.5)
WBC: 6.9 10*3/uL (ref 4.0–10.5)
nRBC: 0 % (ref 0.0–0.2)

## 2022-12-24 LAB — TROPONIN I (HIGH SENSITIVITY)
Troponin I (High Sensitivity): 4 ng/L (ref <18)
Troponin I (High Sensitivity): 4 ng/L (ref <18)

## 2022-12-24 NOTE — ED Provider Notes (Signed)
[facility] Provider Note   CSN: [id number] Arrival date & time: 12/24/22  1339     History  Chief Complaint  Patient presents with   Chest Pain    Sheila Chambers is a 52 y.o. female.   Chest Pain Patient presents with chest pain.  Left chest.  Began at [facility] today.  Went the chest to the left arm.  Not exertional.  No known cardiac history.  No fevers or chills.  No coughing.  No short of breath.  States pain is sharp.  No trauma.  No swelling in the legs.  Does not smoke.  No previous cardiac workup.     Home Medications Prior to Admission medications   Medication Sig Start Date End Date Taking? Authorizing Provider  butalbital-acetaminophen-caffeine (FIORICET) 50-325-40 MG tablet Take 1-2 tablets by mouth every 8 (eight) hours as needed for headache. 02/13/22 02/13/23  Margarita Mail, PA-C  Calcium Carbonate-Vitamin D (CALCIUM + D PO) Take 1 tablet by mouth 2 (two) times daily.     [provider]  ferrous sulfate 325 (65 FE) MG tablet Take 1 tablet (325 mg total) by mouth daily. 08/08/19 11/06/19  Gildardo Pounds, NP  Multiple Vitamin (MULTIVITAMIN WITH MINERALS) TABS Take 1 tablet by mouth daily.    [provider]      Allergies    Codeine    Review of Systems   Review of Systems  Cardiovascular:  Positive for chest pain.    Physical Exam Updated Vital Signs BP (!) 129/92 (BP Location: Right Arm)   Pulse 67   Temp 97.6 F (36.4 C)   Resp 16   Ht 5\' 7"  (1.702 m)   Wt 82.1 kg   SpO2 100%   BMI 28.35 kg/m  Physical Exam Vitals reviewed.  Cardiovascular:     Rate and Rhythm: Regular rhythm.  Pulmonary:     Breath sounds: No wheezing, rhonchi or rales.  Chest:     Chest wall: No tenderness.  Abdominal:     Tenderness: There is no abdominal tenderness.  Musculoskeletal:     Cervical back: Neck supple.  Skin:    General: Skin is warm.  Neurological:     Mental Status: She is alert.      ED Results / Procedures / Treatments   Labs (all labs ordered are listed, but only abnormal results are displayed) Labs Reviewed  BASIC METABOLIC PANEL  CBC  I-STAT BETA HCG BLOOD, ED ([place], [facility], [facility] ONLY)  TROPONIN I (HIGH SENSITIVITY)  TROPONIN I (HIGH SENSITIVITY)    EKG EKG Interpretation  Date/Time:  Thursday December 24 2022 13:33:18 EST Ventricular Rate:  64 PR Interval:  140 QRS Duration: 88 QT Interval:  380 QTC Calculation: 392 R Axis:   50 Text Interpretation: Normal sinus rhythm with sinus arrhythmia Nonspecific T wave abnormality Abnormal ECG When compared with ECG of 02-Jan-2019 08:03, No significant change since last tracing Confirmed by Davonna Belling [phone]) on 12/24/2022 2:51:16 PM  Radiology No results found.  Procedures Procedures    Medications Ordered in ED Medications - No data to display  ED Course/ Medical Decision Making/ A&P                             Medical Decision Making Amount and/or Complexity of Data Reviewed Labs: ordered. Radiology: ordered.   Patient with chest pain.  Anterior chest.  Left side.  Differential diagnosis  includes coronary disease, chest wall pain, pneumonia, musculoskeletal pain.  Will get EKG basic blood work.  EKG independently interpreted reassuring.  Chest x-ray also did not show clear cause of the pain.  Will get basic blood work and troponins.  Care turned over to Dr. Mayra Neer.        Final Clinical Impression(s) / ED Diagnoses Final diagnoses:  None    Rx / DC Orders ED Discharge Orders     None         Davonna Belling, MD 12/24/22 [phone]

## 2022-12-24 NOTE — ED Triage Notes (Addendum)
Pt came in via POV from [facility] d/t CP that began while she was at [facility] this morning. Central CP that radiated into the Lt arm, endorses SOB, rates the pain 10/10, denies any cardiac Hx, but said this happened once before in the past unable to remember when, A/Ox4.

## 2022-12-24 NOTE — ED Provider Notes (Signed)
4:20 PM Assumed care of patient from off-going team. For more details, please see note from same day.  In brief, this is a 52 y.o. female w/ left chest pain. Lab w/u reassuring.  Plan/Dispo at time of sign-out & ED Course since sign-out: [ ]  delta trop, DC  BP (!) 129/92 (BP Location: Right Arm)   Pulse 67   Temp 97.6 F (36.4 C)   Resp 16   Ht 5\' 7"  (1.702 m)   Wt 82.1 kg   SpO2 100%   BMI 28.35 kg/m    ED Course:   Clinical Course as of 12/24/22 1819  Thu Dec 24, 2022  1726 Troponin I (High Sensitivity): 4 Repeat neg [HN]    Clinical Course User Index [HN] Audley Hose, MD    Dispo: DC w/ f/u and return precautions ------------------------------- Cindee Lame, MD Emergency Medicine  This note was created using dictation software, which may contain spelling or grammatical errors.   Audley Hose, MD 12/24/22 [phone]

## 2022-12-24 NOTE — Discharge Instructions (Addendum)
Follow up with your Doctor within 1-2 weeks
